# Patient Record
Sex: Female | Born: 1935 | Race: White | Hispanic: No | Smoking: Never smoker
Health system: Southern US, Community
[De-identification: ages and names within clinical notes are randomized; demographics above are authoritative.]

---

## 2018-12-23 ENCOUNTER — Other Ambulatory Visit: Payer: Self-pay

## 2018-12-23 ENCOUNTER — Inpatient Hospital Stay (HOSPITAL_COMMUNITY)
Admission: AD | Admit: 2018-12-23 | Discharge: 2018-12-28 | DRG: 177 | Disposition: A | Discharge status: Skilled Nursing Facility | Payer: No Typology Code available for payment source | Source: Other Acute Inpatient Hospital | Attending: Internal Medicine | Admitting: Internal Medicine

## 2018-12-23 ENCOUNTER — Encounter (HOSPITAL_COMMUNITY): Payer: Self-pay

## 2018-12-23 DIAGNOSIS — U071 COVID-19: Principal | ICD-10-CM | POA: Diagnosis present

## 2018-12-23 DIAGNOSIS — F0390 Unspecified dementia without behavioral disturbance: Secondary | ICD-10-CM | POA: Diagnosis present

## 2018-12-23 DIAGNOSIS — J9600 Acute respiratory failure, unspecified whether with hypoxia or hypercapnia: Secondary | ICD-10-CM | POA: Diagnosis not present

## 2018-12-23 DIAGNOSIS — I361 Nonrheumatic tricuspid (valve) insufficiency: Secondary | ICD-10-CM | POA: Diagnosis not present

## 2018-12-23 DIAGNOSIS — Z66 Do not resuscitate: Secondary | ICD-10-CM | POA: Diagnosis present

## 2018-12-23 DIAGNOSIS — E039 Hypothyroidism, unspecified: Secondary | ICD-10-CM | POA: Diagnosis present

## 2018-12-23 DIAGNOSIS — N289 Disorder of kidney and ureter, unspecified: Secondary | ICD-10-CM | POA: Diagnosis present

## 2018-12-23 DIAGNOSIS — K589 Irritable bowel syndrome without diarrhea: Secondary | ICD-10-CM | POA: Diagnosis present

## 2018-12-23 DIAGNOSIS — Z79890 Hormone replacement therapy (postmenopausal): Secondary | ICD-10-CM

## 2018-12-23 DIAGNOSIS — Z79899 Other long term (current) drug therapy: Secondary | ICD-10-CM | POA: Diagnosis not present

## 2018-12-23 DIAGNOSIS — I248 Other forms of acute ischemic heart disease: Secondary | ICD-10-CM | POA: Diagnosis present

## 2018-12-23 DIAGNOSIS — R935 Abnormal findings on diagnostic imaging of other abdominal regions, including retroperitoneum: Secondary | ICD-10-CM

## 2018-12-23 DIAGNOSIS — J9601 Acute respiratory failure with hypoxia: Secondary | ICD-10-CM | POA: Diagnosis present

## 2018-12-23 DIAGNOSIS — A09 Infectious gastroenteritis and colitis, unspecified: Secondary | ICD-10-CM | POA: Diagnosis present

## 2018-12-23 DIAGNOSIS — R7303 Prediabetes: Secondary | ICD-10-CM | POA: Diagnosis present

## 2018-12-23 DIAGNOSIS — R0902 Hypoxemia: Secondary | ICD-10-CM

## 2018-12-23 DIAGNOSIS — J1289 Other viral pneumonia: Secondary | ICD-10-CM | POA: Diagnosis present

## 2018-12-23 DIAGNOSIS — E876 Hypokalemia: Secondary | ICD-10-CM | POA: Diagnosis present

## 2018-12-23 DIAGNOSIS — J1282 Pneumonia due to coronavirus disease 2019: Secondary | ICD-10-CM | POA: Diagnosis present

## 2018-12-24 ENCOUNTER — Inpatient Hospital Stay (HOSPITAL_COMMUNITY): Payer: No Typology Code available for payment source

## 2018-12-24 DIAGNOSIS — I361 Nonrheumatic tricuspid (valve) insufficiency: Secondary | ICD-10-CM

## 2018-12-24 DIAGNOSIS — J1282 Pneumonia due to coronavirus disease 2019: Secondary | ICD-10-CM | POA: Diagnosis present

## 2018-12-24 DIAGNOSIS — U071 COVID-19: Secondary | ICD-10-CM | POA: Diagnosis present

## 2018-12-24 DIAGNOSIS — J9600 Acute respiratory failure, unspecified whether with hypoxia or hypercapnia: Secondary | ICD-10-CM | POA: Diagnosis present

## 2018-12-24 DIAGNOSIS — J1289 Other viral pneumonia: Secondary | ICD-10-CM

## 2018-12-24 LAB — CBC WITH DIFFERENTIAL/PLATELET
Abs Immature Granulocytes: 0.02 10*3/uL (ref 0.00–0.07)
Basophils Absolute: 0 10*3/uL (ref 0.0–0.1)
Basophils Relative: 0 %
Eosinophils Absolute: 0 10*3/uL (ref 0.0–0.5)
Eosinophils Relative: 0 %
HCT: 39.8 % (ref 36.0–46.0)
Hemoglobin: 12.8 g/dL (ref 12.0–15.0)
Immature Granulocytes: 0 %
Lymphocytes Relative: 18 %
Lymphs Abs: 1 10*3/uL (ref 0.7–4.0)
MCH: 29.7 pg (ref 26.0–34.0)
MCHC: 32.2 g/dL (ref 30.0–36.0)
MCV: 92.3 fL (ref 80.0–100.0)
Monocytes Absolute: 0.4 10*3/uL (ref 0.1–1.0)
Monocytes Relative: 8 %
Neutro Abs: 4.3 10*3/uL (ref 1.7–7.7)
Neutrophils Relative %: 74 %
Platelets: 228 10*3/uL (ref 150–400)
RBC: 4.31 MIL/uL (ref 3.87–5.11)
RDW: 14.3 % (ref 11.5–15.5)
WBC: 5.8 10*3/uL (ref 4.0–10.5)
nRBC: 0 % (ref 0.0–0.2)

## 2018-12-24 LAB — COMPREHENSIVE METABOLIC PANEL
ALT: 17 U/L (ref 0–44)
AST: 53 U/L — ABNORMAL HIGH (ref 15–41)
Albumin: 3.3 g/dL — ABNORMAL LOW (ref 3.5–5.0)
Alkaline Phosphatase: 59 U/L (ref 38–126)
Anion gap: 14 (ref 5–15)
BUN: 14 mg/dL (ref 8–23)
CO2: 26 mmol/L (ref 22–32)
Calcium: 8.5 mg/dL — ABNORMAL LOW (ref 8.9–10.3)
Chloride: 102 mmol/L (ref 98–111)
Creatinine, Ser: 0.85 mg/dL (ref 0.44–1.00)
GFR calc Af Amer: >60 mL/min (ref >60)
GFR calc non Af Amer: >60 mL/min (ref >60)
Glucose, Bld: 138 mg/dL — ABNORMAL HIGH (ref 70–99)
Potassium: 2.2 mmol/L — CL (ref 3.5–5.1)
Sodium: 142 mmol/L (ref 135–145)
Total Bilirubin: 0.4 mg/dL (ref 0.3–1.2)
Total Protein: 7.3 g/dL (ref 6.5–8.1)

## 2018-12-24 LAB — ECHOCARDIOGRAM LIMITED
Height: 65 in
Weight: 2433.88 oz

## 2018-12-24 LAB — BASIC METABOLIC PANEL
Anion gap: 14 (ref 5–15)
BUN: 13 mg/dL (ref 8–23)
CO2: 25 mmol/L (ref 22–32)
Calcium: 8.4 mg/dL — ABNORMAL LOW (ref 8.9–10.3)
Chloride: 104 mmol/L (ref 98–111)
Creatinine, Ser: 0.75 mg/dL (ref 0.44–1.00)
GFR calc Af Amer: >60 mL/min (ref >60)
GFR calc non Af Amer: >60 mL/min (ref >60)
Glucose, Bld: 89 mg/dL (ref 70–99)
Potassium: 3.2 mmol/L — ABNORMAL LOW (ref 3.5–5.1)
Sodium: 143 mmol/L (ref 135–145)

## 2018-12-24 LAB — MAGNESIUM: Magnesium: 1.9 mg/dL (ref 1.7–2.4)

## 2018-12-24 LAB — LIPID PANEL
Cholesterol: 225 mg/dL — ABNORMAL HIGH (ref 0–200)
HDL: 29 mg/dL — ABNORMAL LOW (ref >40)
LDL Cholesterol: 154 mg/dL — ABNORMAL HIGH (ref 0–99)
Total CHOL/HDL Ratio: 7.8 RATIO
Triglycerides: 212 mg/dL — ABNORMAL HIGH (ref <150)
VLDL: 42 mg/dL — ABNORMAL HIGH (ref 0–40)

## 2018-12-24 LAB — PROCALCITONIN: Procalcitonin: <0.1 ng/mL

## 2018-12-24 LAB — D-DIMER, QUANTITATIVE: D-Dimer, Quant: 0.66 ug/mL-FEU — ABNORMAL HIGH (ref 0.00–0.50)

## 2018-12-24 LAB — BRAIN NATRIURETIC PEPTIDE: B Natriuretic Peptide: 673.2 pg/mL — ABNORMAL HIGH (ref 0.0–100.0)

## 2018-12-24 LAB — ABO/RH: ABO/RH(D): O POS

## 2018-12-24 LAB — TROPONIN I (HIGH SENSITIVITY)
Troponin I (High Sensitivity): 3086 ng/L (ref <18)
Troponin I (High Sensitivity): 2732 ng/L (ref <18)

## 2018-12-24 LAB — HEMOGLOBIN A1C
Hgb A1c MFr Bld: 6.2 % — ABNORMAL HIGH (ref 4.8–5.6)
Mean Plasma Glucose: 131.24 mg/dL

## 2018-12-24 LAB — C-REACTIVE PROTEIN: CRP: 1 mg/dL — ABNORMAL HIGH (ref <1.0)

## 2018-12-24 MED ORDER — SODIUM CHLORIDE 0.9% FLUSH
3.0000 mL | Freq: Two times a day (BID) | INTRAVENOUS | Status: DC
  Administered 2018-12-24 – 2018-12-28 (×11): 3 mL via INTRAVENOUS

## 2018-12-24 MED ORDER — SERTRALINE HCL 50 MG PO TABS
50.0000 mg | ORAL_TABLET | Freq: Every day | ORAL | Status: DC
  Administered 2018-12-24 – 2018-12-28 (×5): 50 mg via ORAL
  Filled 2018-12-24 (×5): qty 1

## 2018-12-24 MED ORDER — POTASSIUM CHLORIDE CRYS ER 20 MEQ PO TBCR
40.0000 meq | EXTENDED_RELEASE_TABLET | Freq: Once | ORAL | Status: AC
  Administered 2018-12-24: 40 meq via ORAL
  Filled 2018-12-24: qty 2

## 2018-12-24 MED ORDER — ZINC SULFATE 220 (50 ZN) MG PO CAPS
220.0000 mg | ORAL_CAPSULE | Freq: Every day | ORAL | Status: DC
  Administered 2018-12-24 – 2018-12-28 (×5): 220 mg via ORAL
  Filled 2018-12-24 (×5): qty 1

## 2018-12-24 MED ORDER — ATORVASTATIN CALCIUM 40 MG PO TABS
40.0000 mg | ORAL_TABLET | Freq: Every day | ORAL | Status: DC
  Administered 2018-12-24 – 2018-12-28 (×5): 40 mg via ORAL
  Filled 2018-12-24 (×5): qty 1

## 2018-12-24 MED ORDER — ACETAMINOPHEN 325 MG PO TABS
650.0000 mg | ORAL_TABLET | Freq: Four times a day (QID) | ORAL | Status: DC | PRN
  Administered 2018-12-24 – 2018-12-25 (×2): 650 mg via ORAL
  Filled 2018-12-24 (×2): qty 2

## 2018-12-24 MED ORDER — VITAMIN C 500 MG PO TABS
500.0000 mg | ORAL_TABLET | Freq: Every day | ORAL | Status: DC
  Administered 2018-12-24 – 2018-12-28 (×5): 500 mg via ORAL
  Filled 2018-12-24 (×5): qty 1

## 2018-12-24 MED ORDER — POTASSIUM CHLORIDE CRYS ER 20 MEQ PO TBCR
40.0000 meq | EXTENDED_RELEASE_TABLET | ORAL | Status: AC
  Administered 2018-12-24 (×2): 40 meq via ORAL
  Filled 2018-12-24 (×2): qty 2

## 2018-12-24 MED ORDER — SODIUM CHLORIDE 0.9 % IV SOLN
200.0000 mg | Freq: Once | INTRAVENOUS | Status: AC
  Administered 2018-12-24: 200 mg via INTRAVENOUS
  Filled 2018-12-24: qty 40

## 2018-12-24 MED ORDER — LEVOTHYROXINE SODIUM 75 MCG PO TABS
75.0000 ug | ORAL_TABLET | Freq: Every day | ORAL | Status: DC
  Administered 2018-12-24 – 2018-12-28 (×5): 75 ug via ORAL
  Filled 2018-12-24 (×5): qty 1

## 2018-12-24 MED ORDER — DIVALPROEX SODIUM 125 MG PO CSDR
125.0000 mg | DELAYED_RELEASE_CAPSULE | Freq: Two times a day (BID) | ORAL | Status: DC
  Administered 2018-12-24 – 2018-12-28 (×10): 125 mg via ORAL
  Filled 2018-12-24 (×11): qty 1

## 2018-12-24 MED ORDER — ELUXADOLINE 75 MG PO TABS
75.0000 mg | ORAL_TABLET | Freq: Two times a day (BID) | ORAL | Status: DC
  Administered 2018-12-26: 75 mg via ORAL
  Filled 2018-12-24: qty 1

## 2018-12-24 MED ORDER — ONDANSETRON HCL 4 MG/2ML IJ SOLN: 4.0000 mg | Freq: Four times a day (QID) | INTRAMUSCULAR | Status: DC | PRN

## 2018-12-24 MED ORDER — ENOXAPARIN SODIUM 40 MG/0.4ML ~~LOC~~ SOLN
40.0000 mg | SUBCUTANEOUS | Status: DC
  Administered 2018-12-24 – 2018-12-28 (×5): 40 mg via SUBCUTANEOUS
  Filled 2018-12-24 (×5): qty 0.4

## 2018-12-24 MED ORDER — METOPROLOL TARTRATE 25 MG PO TABS
12.5000 mg | ORAL_TABLET | Freq: Two times a day (BID) | ORAL | Status: DC
  Administered 2018-12-24 – 2018-12-28 (×10): 12.5 mg via ORAL
  Filled 2018-12-24 (×10): qty 1

## 2018-12-24 MED ORDER — ASPIRIN 81 MG PO CHEW
81.0000 mg | CHEWABLE_TABLET | Freq: Every day | ORAL | Status: DC
  Administered 2018-12-24 – 2018-12-28 (×5): 81 mg via ORAL
  Filled 2018-12-24 (×8): qty 1

## 2018-12-24 MED ORDER — DEXAMETHASONE SODIUM PHOSPHATE 10 MG/ML IJ SOLN
6.0000 mg | INTRAMUSCULAR | Status: DC
  Administered 2018-12-24 – 2018-12-26 (×3): 6 mg via INTRAVENOUS
  Filled 2018-12-24 (×3): qty 1

## 2018-12-24 MED ORDER — INSULIN ASPART 100 UNIT/ML ~~LOC~~ SOLN: 0.0000 [IU] | Freq: Every day | SUBCUTANEOUS | Status: DC

## 2018-12-24 MED ORDER — GUAIFENESIN-DM 100-10 MG/5ML PO SYRP: 10.0000 mL | ORAL_SOLUTION | ORAL | Status: DC | PRN

## 2018-12-24 MED ORDER — SODIUM CHLORIDE 0.9 % IV SOLN: 250.0000 mL | INTRAVENOUS | Status: DC | PRN

## 2018-12-24 MED ORDER — SODIUM CHLORIDE 0.9 % IV SOLN
100.0000 mg | INTRAVENOUS | Status: AC
  Administered 2018-12-25 – 2018-12-28 (×4): 100 mg via INTRAVENOUS
  Filled 2018-12-24 (×4): qty 20

## 2018-12-24 MED ORDER — INSULIN ASPART 100 UNIT/ML ~~LOC~~ SOLN
0.0000 [IU] | Freq: Three times a day (TID) | SUBCUTANEOUS | Status: DC
  Administered 2018-12-24 – 2018-12-25 (×3): 1 [IU] via SUBCUTANEOUS
  Administered 2018-12-26: 3 [IU] via SUBCUTANEOUS
  Administered 2018-12-26: 2 [IU] via SUBCUTANEOUS
  Administered 2018-12-28 (×2): 1 [IU] via SUBCUTANEOUS

## 2018-12-24 MED ORDER — ONDANSETRON HCL 4 MG PO TABS: 4.0000 mg | ORAL_TABLET | Freq: Four times a day (QID) | ORAL | Status: DC | PRN

## 2018-12-24 MED ORDER — SODIUM CHLORIDE 0.9% FLUSH: 3.0000 mL | INTRAVENOUS | Status: DC | PRN

## 2018-12-24 MED ORDER — POTASSIUM CHLORIDE 10 MEQ/100ML IV SOLN
10.0000 meq | INTRAVENOUS | Status: AC
  Administered 2018-12-24 (×5): 10 meq via INTRAVENOUS
  Filled 2018-12-24 (×5): qty 100

## 2018-12-24 MED ORDER — HYDROCOD POLST-CPM POLST ER 10-8 MG/5ML PO SUER
5.0000 mL | Freq: Two times a day (BID) | ORAL | Status: DC | PRN
  Administered 2018-12-24 – 2018-12-26 (×3): 5 mL via ORAL
  Filled 2018-12-24 (×4): qty 5

## 2018-12-24 NOTE — Progress Notes (Signed)
  Echocardiogram 2D Echocardiogram has been performed.  Whitney Pearson 12/24/2018, 12:59 PM

## 2018-12-24 NOTE — H&P (Signed)
History and Physical    Whitney Pearson LKT:[id number] DOB: [birth year] DOA: 12/23/2018  PCP: No primary care provider on file.  Patient coming from: snf  Chief Complaint:  hypoxia  HPI: Whitney Pearson is a 85 y.o. female with medical history significant of dementia sent by snf for hypoxia.  No history available and pt cannot provide any due to her dementia.  Found to have covid pna with sats in the 73% range on RA.  Appears comfortable.  Referred for admission for covid pna.  Review of Systems: unobtainable due to dementia  History reviewed. No pertinent past medical history. unknonw  History reviewed. No pertinent surgical history.  unknown   reports that she has never smoked. She has never used smokeless tobacco. She reports that she does not drink alcohol or use drugs.  Not on File unknown  History reviewed. No pertinent family history. unknown  Prior to Admission medications   Medication Sig Start Date End Date Taking? Authorizing Provider  budesonide (PULMICORT) 0.25 MG/2ML nebulizer solution Take 0.25 mg by nebulization 2 (two) times daily.   Yes [provider]  calcium-vitamin D (OSCAL WITH D) 500-200 MG-UNIT tablet Take 1 tablet by mouth.   Yes [provider]  divalproex (DEPAKOTE SPRINKLE) 125 MG capsule Take 125 mg by mouth 2 (two) times daily.   Yes [provider]  Ensure (ENSURE) Take 237 mLs by mouth 3 (three) times daily between meals.   Yes [provider]  fexofenadine (ALLEGRA) 180 MG tablet Take 180 mg by mouth daily.   Yes [provider]  levothyroxine (SYNTHROID) 75 MCG tablet Take 75 mcg by mouth daily before breakfast.   Yes [provider]  oxymetazoline (AFRIN) 0.05 % nasal spray Place 1 spray into both nostrils 2 (two) times daily as needed for congestion.   Yes [provider]  potassium chloride (KLOR-CON) 10 MEQ tablet Take 10 mEq by mouth daily.   Yes [provider]  Probiotic Product (ALIGN) 4 MG CAPS Take 4 mg by mouth daily.   Yes [provider]  sertraline (ZOLOFT) 25 MG tablet Take 50 mg by mouth daily.   Yes [provider]    Physical Exam: Vitals:   12/23/18 2301  BP: 140/67  Pulse: 80  Resp: 18  Temp: 98.1 F (36.7 C)  TempSrc: Oral  Weight: 69 kg  Height: 5\' 5"  (1.651 m)      Constitutional: NAD, calm, comfortable Vitals:   12/23/18 2301  BP: 140/67  Pulse: 80  Resp: 18  Temp: 98.1 F (36.7 C)  TempSrc: Oral  Weight: 69 kg  Height: 5\' 5"  (1.651 m)   Eyes: PERRL, lids and conjunctivae normal ENMT: Mucous membranes are moist. Posterior pharynx clear of any exudate or lesions.Normal dentition.  Neck: normal, supple, no masses, no thyromegaly Respiratory: clear to auscultation bilaterally, no wheezing, no crackles. Normal respiratory effort. No accessory muscle use.  Cardiovascular: Regular rate and rhythm, no murmurs / rubs / gallops. No extremity edema. 2+ pedal pulses. No carotid bruits.  Abdomen: no tenderness, no masses palpated. No hepatosplenomegaly. Bowel sounds positive.  Musculoskeletal: no clubbing / cyanosis. No joint deformity upper and lower extremities. Good ROM, no contractures. Normal muscle tone.  Skin: no rashes, lesions, ulcers. No induration Neurologic:  No focal deficits.  dementia Psychiatric: not agitated   Labs on Admission: I have personally reviewed following labs and imaging studies  CBC: No results for input(s): WBC, NEUTROABS, HGB, HCT, MCV, PLT in  the last 168 hours. Basic Metabolic Panel: No results for input(s): NA, K, CL, CO2, GLUCOSE, BUN, CREATININE, CALCIUM, MG, PHOS in the last 168 hours. GFR: CrCl cannot be calculated (No successful lab value found.). Liver Function Tests: No results for input(s): AST, ALT, ALKPHOS, BILITOT, PROT, ALBUMIN in the last 168 hours. No results for input(s): LIPASE, AMYLASE in the last 168 hours. No results for  input(s): AMMONIA in the last 168 hours. Coagulation Profile: No results for input(s): INR, PROTIME in the last 168 hours. Cardiac Enzymes: No results for input(s): CKTOTAL, CKMB, CKMBINDEX, TROPONINI in the last 168 hours. BNP (last 3 results) No results for input(s): PROBNP in the last 8760 hours. HbA1C: No results for input(s): HGBA1C in the last 72 hours. CBG: No results for input(s): GLUCAP in the last 168 hours. Lipid Profile: No results for input(s): CHOL, HDL, LDLCALC, TRIG, CHOLHDL, LDLDIRECT in the last 72 hours. Thyroid Function Tests: No results for input(s): TSH, T4TOTAL, FREET4, T3FREE, THYROIDAB in the last 72 hours. Anemia Panel: No results for input(s): VITAMINB12, FOLATE, FERRITIN, TIBC, IRON, RETICCTPCT in the last 72 hours. Urine analysis: No results found for: COLORURINE, APPEARANCEUR, LABSPEC, PHURINE, GLUCOSEU, HGBUR, BILIRUBINUR, KETONESUR, PROTEINUR, UROBILINOGEN, NITRITE, LEUKOCYTESUR Sepsis Labs: !!!!!!!!!!!!!!!!!!!!!!!!!!!!!!!!!!!!!!!!!!!! @LABRCNTIP (procalcitonin:4,lacticidven:4) )No results found for this or any previous visit (from the past 240 hour(s)).   Radiological Exams on Admission: No results found.     Assessment/Plan 85 yo female with dementia with acute hypoxia from covid pna  Principal Problem:   Pneumonia due to COVID-19 virus- iv decadron and remdisivir.  Comfortable on [place].  Lovenox.  Ck crp, cmp, cbc, dimer, trop now.  Ct reported also neg pe at [place].    Active Problems:   Acute respiratory failure due to COVID-19 [facility])- as above    Dementia - stable   DVT prophylaxis: lovenox Code Status:  DNR Family Communication:  none Disposition Plan:  dayd Consults called:  none Admission status:  admission   DAVID,RACHAL A MD [facility]  If 7PM-7AM, please contact night-coverage [email or web address] Password [email or web address]  12/24/2018, 2:54 AM

## 2018-12-24 NOTE — Progress Notes (Signed)
Patient discussed with primary team. 85 yo female history of dementia, DNR status transferred from SNF with hypoxia to 70%. COVID +, being managed by primary team Contacted for elevated troponin. EKG reviewed, SR inferior Qwaves no specific acute changes. No chest pain, hemodynamically stable.  Trop peak 3000 trending down, echo pending. Given advanced age, dementia, poor functional status with reasonable explanation for trop elevation in setting of COVID with severe hypoxia would recommend only medical therapy with ASA, statin, beta blocker. Can get echo as if drop in LVEF may guide medical therapy, but would not consider ischemic testing for this patient.   Carlyle Dolly MD

## 2018-12-24 NOTE — Progress Notes (Signed)
Pharmacy Note - Remdesivir Dosing  O:  ALT:  17 CXR: PNA Requiring supplemental O2:  Sat 94% on RA   A/P:  Patient meets criteria for remdesivir.  Begin remdesivir 200 mg IV x 1, followed by 100 mg IV daily x 4 days  Monitor ALT, clinical progress  Despina Pole, Pharm. D. Clinical Pharmacist 12/24/2018 5:25 AM

## 2018-12-24 NOTE — TOC Initial Note (Addendum)
Transition of Care [facility]) - Initial/Assessment Note    Patient Details  Name: Whitney Pearson MRN: [id number] Date of Birth: 1933-07-20  [facility] CM/SW Contact:    Terrilee Croak, Student-Social Work Phone Number: 12/24/2018, 1:46 PM  Clinical Narrative:                    MSW Intern reviewed pt notes and needed additional information to determine next steps. MSW Intern called pt daughter to touch base. Daughter stated that pt was from [facility] and has been a resident of the memory care unit there for 5 years. She also reported that pt was part of [facility].     Daughter confirmed that next step after discharge is to return to [place], as they are very happy with her care there. Pt noted concerns about her mother since she has stopped being able to visit her. She visited pt everyday at [facility], since she had to stop, the pt has lost almost forty pounds. She is also upset, because of her mother's dementia, pt doesn't understand why she is not coming to visit, and cannot touch her. She also knows that pt is confused and disoriented, because she is not where she feels comfortable. Daughter just asked to be updated regularly.    MSW Intern called [facility] to discuss next steps, talked to a nurse in the Memory care unit who gave the number for the person who would be able to answer questions.    MSW Intern called Madalyn Rob, a care manager at [facility], and left a voicemail. Her contact is [phone].     Daughter also noted that patient worked with a Child psychotherapist at [employer]. She didn't remember a name, but granted permission for SW to call over there and discuss patient's situation.     Social Work will continue to follow.  UPDATE 3:50 PM: Leann returned phone call and updated social worker that pt was their first Covid case and she is unsure of the next steps for pt to return. She advised that she would speak with her leadership and provide an  update on Monday 12/26/2018 on the barriers to discharging back to [facility]. SW will continue to follow.        Patient Goals and CMS Choice        Expected Discharge Plan and Services                                                Prior Living Arrangements/Services                       Activities of Daily Living Home Assistive Devices/Equipment: Wheelchair ADL Screening (condition at time of admission) Patient's cognitive ability adequate to safely complete daily activities?: No Is the patient deaf or have difficulty hearing?: No Does the patient have difficulty seeing, even when wearing glasses/contacts?: No Does the patient have difficulty concentrating, remembering, or making decisions?: Yes Patient able to express need for assistance with ADLs?: No Does the patient have difficulty dressing or bathing?: Yes Independently performs ADLs?: No Communication: Independent Dressing (OT): Dependent Is this a change from baseline?: Pre-admission baseline Grooming: Dependent Is this a change from baseline?: Pre-admission baseline Feeding: Needs assistance Is this a change from baseline?: Pre-admission baseline Bathing: Dependent Is this a change  from baseline?: Pre-admission baseline In/Out Bed: Dependent Is this a change from baseline?: Pre-admission baseline Walks in Home: Dependent Is this a change from baseline?: Pre-admission baseline Does the patient have difficulty walking or climbing stairs?: Yes Weakness of Legs: Both Weakness of Arms/Hands: Both  Permission Sought/Granted                  Emotional Assessment              Admission diagnosis:  COVID-19 VIRUS INFECTION Patient Active Problem List   Diagnosis Date Noted  . Pneumonia due to COVID-19 virus 12/24/2018  . Acute respiratory failure due to COVID-19 [facility]) 12/24/2018   PCP:  System, Pcp Not In Pharmacy:  No Pharmacies Listed    Social Determinants of Health  (SDOH) Interventions    Readmission Risk Interventions No flowsheet data found.

## 2018-12-24 NOTE — Progress Notes (Signed)
PROGRESS NOTE    Whitney Pearson  ZOX:[id number] DOB: [birth year] DOA: 12/23/2018 PCP: System, Pcp Not In   Brief Narrative:  Whitney LippsBetty Childress Forero is Whitney Pearson 85 y.o. female with medical history significant of dementia sent by snf for hypoxia.  No history available and pt cannot provide any due to her dementia.  Found to have covid pna with sats in the 73% range on RA.  Appears comfortable.  Referred for admission for covid pna.  Assessment & Plan:   Principal Problem:   Pneumonia due to COVID-19 virus Active Problems:   Acute respiratory failure due to COVID-19 (HCC)  COVID 19 Viral Infection  Diarrhea:  Reportedly with O2 sats 73 on presentation Currently satting well on RA Remdesivir, steroids CT chest at [place] without central/lobar PE (limited exam), consolidation and volume loss in LLL with heterogenous attenuation concerning for underlying infection. Daily labs -> CBC, CMP, CRP, ferritin, D dimer I/O, daily weights Pt with diarrhea, suspect related to COVID 19 infection, continue to monitor - w/u further if worsening or persistent [facility]  COVID-19 Labs  Recent Labs    12/24/18 0238  DDIMER 0.66*  CRP 1.0*    No results found for: SARSCOV2NAA  Elevated Troponin: possibly related to COVID 19 infection with demand ischemia vs myocarditis vs NSTEMI.  Troponin downtrending.  Discussed with daughter who was agreeable with conservative management.  Discussed with cards, recommending ASA, statin, beta blocker. Echo with EF 55-60%, grade 2 diastolic dysfunction (see report) LDL 154, A1c 6.2  Dementia: delirium precautions, pt is impulsive, tele sitter ordered Continue depakote and zoloft  IBS: diarrhea could be 2/2 IBS, though seems worse than this - continue viberzi  Hypokalemia: replace and follow - follow mag  Hypothyroidism: continue synthroid  5 mm nodule in R major fissure: follow outpatient  1.6 CM lesion upper  pole L kidney: recommend f/u with US at some point  [facility] IMPRESSION: 1. Evaluation beyond the lobar level is limited due to respiratory motion artifact which is most pronounced in the lung bases. No central or lobar pulmonary emboli are seen 2. Focal consolidation and volume loss in the left lower lobe with heterogeneous attenuation suspicious for an underlying infection in the setting of known COVID-19 positivity. 3. Mild central pulmonary artery enlargement without elevation of the RV/LV ratio, suggestive of underlying pulmonary arterial hypertension. 4. Few calcified granulomata are present 5. 5mm solid nodule present right major fissure. No follow-up needed if patient is low-risk. Non-contrast chest CT can be considered in 12 months if patient is high-risk. This recommendation follows the consensus statement: Guidelines for Management of Incidental Pulmonary Nodules Detected on CT Images: From the Fleischner Society 2017; Radiology 2017; 284:228-243. 6. Indeterminate 1.6 cm intermediate attenuation lesion in upper pole left kidney, which could represent Shemiah Rosch proteinaceous/hemorrhagic cyst or solid mass. Further evaluation with ultrasound is recommended. 7. Aortic Atherosclerosis (ICD10-I70.0).  DVT prophylaxis: lovenox Code Status: DNR Family Communication: discussed with duaghter Disposition Plan: pending   Consultants:   Cardiology over phone  Procedures:  Echo IMPRESSIONS    1. Left ventricular ejection fraction, by visual estimation, is 55 to 60%. The left ventricle has normal function. There is no left ventricular hypertrophy.  2. Elevated left atrial pressure.  3. Left ventricular diastolic parameters are consistent with Grade II diastolic dysfunction (pseudonormalization).  4. Global right ventricle has normal systolic function.The right ventricular size is normal. No increase in right ventricular wall thickness.  5. Left atrial  size was normal.   6. Right atrial size was normal.  7. Mild mitral annular calcification.  8. The mitral valve is normal in structure. Trace mitral valve regurgitation. No evidence of mitral stenosis.  9. The tricuspid valve is normal in structure. Tricuspid valve regurgitation is mild. 10. The aortic valve is tricuspid. Aortic valve regurgitation is not visualized. No evidence of aortic valve sclerosis or stenosis. 11. The pulmonic valve was not well visualized. Pulmonic valve regurgitation is not visualized. 12. The interatrial septum was not well visualized.  Antimicrobials:  Anti-infectives (From admission, onward)   Start     Dose/Rate Route Frequency Ordered Stop   12/25/18 1000  remdesivir 100 mg in sodium chloride 0.9 % 250 mL IVPB     100 mg 500 mL/hr over 30 Minutes Intravenous Every 24 hours 12/24/18 0517 12/29/18 0959   12/24/18 0600  remdesivir 200 mg in sodium chloride 0.9 % 250 mL IVPB     200 mg 500 mL/hr over 30 Minutes Intravenous Once 12/24/18 0517 12/24/18 1147     Subjective: Confused Denies CP or SOB  Objective: Vitals:   12/23/18 2301 12/24/18 0400 12/24/18 0734 12/24/18 1540  BP: 140/67 123/70 133/85 133/85  Pulse: 80 68 72 73  Resp: 18 18 18 20   Temp: 98.1 F (36.7 C) 98.9 F (37.2 C) 97.6 F (36.4 C) 98.1 F (36.7 C)  TempSrc: Oral Oral Oral Oral  SpO2:  94% 95% 96%  Weight: 69 kg     Height: 5\' 5"  (1.651 m)       Intake/Output Summary (Last 24 hours) at 12/24/2018 1704 Last data filed at 12/24/2018 1500 Gross per 24 hour  Intake 250.28 ml  Output -  Net 250.28 ml   Filed Weights   12/23/18 2301  Weight: 69 kg    Examination:  General exam: Appears calm and comfortable  Respiratory system: unlabored Cardiovascular system: RRR Gastrointestinal system: Abdomen is nondistended, soft and nontender. Green diarrhea. Central nervous system: Alert and disoriented. No focal neurological deficits. Extremities: no LEE Skin: No rashes, lesions or ulcers  Psychiatry: Judgement and insight appear normal. Mood & affect appropriate.     Data Reviewed: I have personally reviewed following labs and imaging studies  CBC: Recent Labs  Lab 12/24/18 0238  WBC 5.8  NEUTROABS 4.3  HGB 12.8  HCT 39.8  MCV 92.3  PLT 578   Basic Metabolic Panel: Recent Labs  Lab 12/24/18 0238 12/24/18 0412 12/24/18 1302  NA 142  --  143  K 2.2*  --  3.2*  CL 102  --  104  CO2 26  --  25  GLUCOSE 138*  --  89  BUN 14  --  13  CREATININE 0.85  --  0.75  CALCIUM 8.5*  --  8.4*  MG  --  1.9  --    GFR: Estimated Creatinine Clearance: 52 mL/min (by C-G formula based on SCr of 0.75 mg/dL). Liver Function Tests: Recent Labs  Lab 12/24/18 0238  AST 53*  ALT 17  ALKPHOS 59  BILITOT 0.4  PROT 7.3  ALBUMIN 3.3*   No results for input(s): LIPASE, AMYLASE in the last 168 hours. No results for input(s): AMMONIA in the last 168 hours. Coagulation Profile: No results for input(s): INR, PROTIME in the last 168 hours. Cardiac Enzymes: No results for input(s): CKTOTAL, CKMB, CKMBINDEX, TROPONINI in the last 168 hours. BNP (last 3 results) No results for input(s): PROBNP in the last 8760 hours. HbA1C: Recent Labs  12/24/18 0238  HGBA1C 6.2*   CBG: No results for input(s): GLUCAP in the last 168 hours. Lipid Profile: Recent Labs    12/24/18 0800  CHOL 225*  HDL 29*  LDLCALC 154*  TRIG 212*  CHOLHDL 7.8   Thyroid Function Tests: No results for input(s): TSH, T4TOTAL, FREET4, T3FREE, THYROIDAB in the last 72 hours. Anemia Panel: No results for input(s): VITAMINB12, FOLATE, FERRITIN, TIBC, IRON, RETICCTPCT in the last 72 hours. Sepsis Labs: Recent Labs  Lab 12/24/18 0238  PROCALCITON <0.10    No results found for this or any previous visit (from the past 240 hour(s)).       Radiology Studies: No results found.      Scheduled Meds: . aspirin  81 mg Oral Daily  . atorvastatin  40 mg Oral q1800  . dexamethasone (DECADRON)  injection  6 mg Intravenous Q24H  . divalproex  125 mg Oral BID  . enoxaparin (LOVENOX) injection  40 mg Subcutaneous Q24H  . insulin aspart  0-5 Units Subcutaneous QHS  . insulin aspart  0-9 Units Subcutaneous TID WC  . levothyroxine  75 mcg Oral QAC breakfast  . metoprolol tartrate  12.5 mg Oral BID  . sertraline  50 mg Oral Daily  . sodium chloride flush  3 mL Intravenous Q12H  . vitamin C  500 mg Oral Daily  . zinc sulfate  220 mg Oral Daily   Continuous Infusions: . sodium chloride    . [START ON 12/25/2018] remdesivir 100 mg in NS 250 mL       LOS: 1 day    Time spent: over 30 min    Lacretia Nicks, MD [facility] Pager AMION  If 7PM-7AM, please contact night-coverage [email or web address] Password [facility] 12/24/2018, 5:04 PM

## 2018-12-24 NOTE — Progress Notes (Signed)
Spoke with Tessie Fass, her daughter, and answered all questions at this time.

## 2018-12-25 ENCOUNTER — Inpatient Hospital Stay (HOSPITAL_COMMUNITY): Payer: No Typology Code available for payment source

## 2018-12-25 LAB — CBC WITH DIFFERENTIAL/PLATELET
Abs Immature Granulocytes: 0.04 10*3/uL (ref 0.00–0.07)
Basophils Absolute: 0 10*3/uL (ref 0.0–0.1)
Basophils Relative: 1 %
Eosinophils Absolute: 0 10*3/uL (ref 0.0–0.5)
Eosinophils Relative: 0 %
HCT: 39.1 % (ref 36.0–46.0)
Hemoglobin: 12.5 g/dL (ref 12.0–15.0)
Immature Granulocytes: 1 %
Lymphocytes Relative: 25 %
Lymphs Abs: 1.4 10*3/uL (ref 0.7–4.0)
MCH: 29.3 pg (ref 26.0–34.0)
MCHC: 32 g/dL (ref 30.0–36.0)
MCV: 91.6 fL (ref 80.0–100.0)
Monocytes Absolute: 0.5 10*3/uL (ref 0.1–1.0)
Monocytes Relative: 8 %
Neutro Abs: 3.8 10*3/uL (ref 1.7–7.7)
Neutrophils Relative %: 65 %
Platelets: 235 10*3/uL (ref 150–400)
RBC: 4.27 MIL/uL (ref 3.87–5.11)
RDW: 14.2 % (ref 11.5–15.5)
WBC: 5.7 10*3/uL (ref 4.0–10.5)
nRBC: 0 % (ref 0.0–0.2)

## 2018-12-25 LAB — COMPREHENSIVE METABOLIC PANEL
ALT: 16 U/L (ref 0–44)
AST: 47 U/L — ABNORMAL HIGH (ref 15–41)
Albumin: 3.3 g/dL — ABNORMAL LOW (ref 3.5–5.0)
Alkaline Phosphatase: 57 U/L (ref 38–126)
Anion gap: 13 (ref 5–15)
BUN: 13 mg/dL (ref 8–23)
CO2: 25 mmol/L (ref 22–32)
Calcium: 8.8 mg/dL — ABNORMAL LOW (ref 8.9–10.3)
Chloride: 105 mmol/L (ref 98–111)
Creatinine, Ser: 0.88 mg/dL (ref 0.44–1.00)
GFR calc Af Amer: >60 mL/min (ref >60)
GFR calc non Af Amer: >60 mL/min (ref >60)
Glucose, Bld: 120 mg/dL — ABNORMAL HIGH (ref 70–99)
Potassium: 3 mmol/L — ABNORMAL LOW (ref 3.5–5.1)
Sodium: 143 mmol/L (ref 135–145)
Total Bilirubin: 0.4 mg/dL (ref 0.3–1.2)
Total Protein: 7.3 g/dL (ref 6.5–8.1)

## 2018-12-25 LAB — GLUCOSE, CAPILLARY: Glucose-Capillary: 98 mg/dL (ref 70–99)

## 2018-12-25 LAB — C-REACTIVE PROTEIN: CRP: 2.3 mg/dL — ABNORMAL HIGH (ref <1.0)

## 2018-12-25 LAB — FERRITIN: Ferritin: 132 ng/mL (ref 11–307)

## 2018-12-25 LAB — D-DIMER, QUANTITATIVE: D-Dimer, Quant: 0.56 ug/mL-FEU — ABNORMAL HIGH (ref 0.00–0.50)

## 2018-12-25 MED ORDER — POTASSIUM CHLORIDE CRYS ER 20 MEQ PO TBCR
40.0000 meq | EXTENDED_RELEASE_TABLET | ORAL | Status: AC
  Administered 2018-12-25 (×2): 40 meq via ORAL
  Filled 2018-12-25 (×2): qty 2

## 2018-12-25 NOTE — Progress Notes (Addendum)
PROGRESS NOTE    Whitney Pearson  UXL:[id number] DOB: 09/10/1933 DOA: 12/23/2018 PCP: System, Pcp Not In   Brief Narrative:  Whitney Pearson is a 85 y.o. female with medical history significant of dementia sent by snf for hypoxia.  No history available and pt cannot provide any due to her dementia.  Found to have covid pna with sats in the 73% range on RA.  Appears comfortable.  Referred for admission for covid pna.  Assessment & Plan:   Principal Problem:   Pneumonia due to COVID-19 virus Active Problems:   Acute respiratory failure due to COVID-19 (HCC)  COVID 19 Viral Infection  Diarrhea:  Reportedly with O2 sats 73 on presentation Currently satting well on RA Remdesivir, steroids CT chest at [place] without central/lobar PE (limited exam), consolidation and volume loss in LLL with heterogenous attenuation concerning for underlying infection. CXR 11/8 with LLL hazy airspace disease - atelectasis vs pneumonia Daily labs -> CBC, CMP, CRP, ferritin, D dimer I/O, daily weights Pt with diarrhea, suspect related to COVID 19 infection, continue to monitor - w/u further if worsening or persistent Positive COVID 19 testing documented at [facility] She looks improved today, sitting in chair  COVID-19 Labs  Recent Labs    12/24/18 0238 12/25/18 0218  DDIMER 0.66* 0.56*  FERRITIN  --  132  CRP 1.0* 2.3*    No results found for: SARSCOV2NAA  Elevated Troponin: possibly related to COVID 19 infection with demand ischemia vs myocarditis vs NSTEMI.  Troponin downtrending.  Discussed with daughter who was agreeable with conservative management.  Discussed with cards, recommending ASA, statin, beta blocker. Echo with EF 55-60%, grade 2 diastolic dysfunction (see report) - no reported WMA LDL 154, A1c 6.2  Dementia: delirium precautions, pt is impulsive, tele sitter ordered Continue depakote and zoloft  IBS: diarrhea could be 2/2 IBS, though seems worse than this -  continue viberzi  Hypokalemia: replace and follow - follow mag  Hypothyroidism: continue synthroid  5 mm nodule in R major fissure: follow outpatient  1.6 CM lesion upper pole L kidney: recommend f/u with [place] at some point  CT chest [place] IMPRESSION: 1. Evaluation beyond the lobar level is limited due to respiratory motion artifact which is most pronounced in the lung bases. No central or lobar pulmonary emboli are seen 2. Focal consolidation and volume loss in the left lower lobe with heterogeneous attenuation suspicious for an underlying infection in the setting of known COVID-19 positivity. 3. Mild central pulmonary artery enlargement without elevation of the RV/LV ratio, suggestive of underlying pulmonary arterial hypertension. 4. Few calcified granulomata are present 5. 40mm solid nodule present right major fissure. No follow-up needed if patient is low-risk. Non-contrast chest CT can be considered in 12 months if patient is high-risk. This recommendation follows the consensus statement: Guidelines for Management of Incidental Pulmonary Nodules Detected on CT Images: From the Fleischner Society 2017; Radiology 2017; 284:228-243. 6. Indeterminate 1.6 cm intermediate attenuation lesion in upper pole left kidney, which could represent a proteinaceous/hemorrhagic cyst or solid mass. Further evaluation with ultrasound is recommended. 7. Aortic Atherosclerosis (ICD10-I70.0).  DVT prophylaxis: lovenox Code Status: DNR Family Communication: discussed with duaghter 11/8 Disposition Plan: pending further improvement, completion of remdesivir   Consultants:   Cardiology over phone  Procedures:  Echo IMPRESSIONS    1. Left ventricular ejection fraction, by visual estimation, is 55 to 60%. The left ventricle has normal function. There is no left ventricular hypertrophy.  2. Elevated left atrial pressure.  3. Left ventricular diastolic parameters are consistent with Grade  II diastolic dysfunction (pseudonormalization).  4. Global right ventricle has normal systolic function.The right ventricular size is normal. No increase in right ventricular wall thickness.  5. Left atrial size was normal.  6. Right atrial size was normal.  7. Mild mitral annular calcification.  8. The mitral valve is normal in structure. Trace mitral valve regurgitation. No evidence of mitral stenosis.  9. The tricuspid valve is normal in structure. Tricuspid valve regurgitation is mild. 10. The aortic valve is tricuspid. Aortic valve regurgitation is not visualized. No evidence of aortic valve sclerosis or stenosis. 11. The pulmonic valve was not well visualized. Pulmonic valve regurgitation is not visualized. 12. The interatrial septum was not well visualized.  Antimicrobials:  Anti-infectives (From admission, onward)   Start     Dose/Rate Route Frequency Ordered Stop   12/25/18 1000  remdesivir 100 mg in sodium chloride 0.9 % 250 mL IVPB     100 mg 500 mL/hr over 30 Minutes Intravenous Every 24 hours 12/24/18 0517 12/29/18 0959   12/24/18 0600  remdesivir 200 mg in sodium chloride 0.9 % 250 mL IVPB     200 mg 500 mL/hr over 30 Minutes Intravenous Once 12/24/18 0517 12/24/18 1147     Subjective: Confused, A&Ox1 Pleasant.  No complaints.  Sitting up in chair.   Objective: Vitals:   12/24/18 1540 12/24/18 2035 12/25/18 0349 12/25/18 0748  BP: 133/85 130/85 (!) 128/96 (!) 159/88  Pulse: 73 86 61 60  Resp: 20 19 18 17   Temp: 98.1 F (36.7 C) 98.3 F (36.8 C) 98.5 F (36.9 C) 97.8 F (36.6 C)  TempSrc: Oral Oral Axillary Oral  SpO2: 96% 93% 98% 97%  Weight:      Height:        Intake/Output Summary (Last 24 hours) at 12/25/2018 1559 Last data filed at 12/25/2018 1500 Gross per 24 hour  Intake 250 ml  Output -  Net 250 ml   Filed Weights   12/23/18 2301  Weight: 69 kg    Examination:  General: No acute distress.  Sitting up in chair. Cardiovascular: RRR Lungs:  unlabored Abdomen: Soft, nontender, nondistended  Neurological: Alert and oriented 1. Moves all extremities 4. Cranial nerves II through XII grossly intact. Skin: Warm and dry. No rashes or lesions. Extremities: No clubbing or cyanosis. No edema.   Data Reviewed: I have personally reviewed following labs and imaging studies  CBC: Recent Labs  Lab 12/24/18 0238 12/25/18 0218  WBC 5.8 5.7  NEUTROABS 4.3 3.8  HGB 12.8 12.5  HCT 39.8 39.1  MCV 92.3 91.6  PLT 228 062   Basic Metabolic Panel: Recent Labs  Lab 12/24/18 0238 12/24/18 0412 12/24/18 1302 12/25/18 0218  NA 142  --  143 143  K 2.2*  --  3.2* 3.0*  CL 102  --  104 105  CO2 26  --  25 25  GLUCOSE 138*  --  89 120*  BUN 14  --  13 13  CREATININE 0.85  --  0.75 0.88  CALCIUM 8.5*  --  8.4* 8.8*  MG  --  1.9  --   --    GFR: Estimated Creatinine Clearance: 47.3 mL/min (by C-G formula based on SCr of 0.88 mg/dL). Liver Function Tests: Recent Labs  Lab 12/24/18 0238 12/25/18 0218  AST 53* 47*  ALT 17 16  ALKPHOS 59 57  BILITOT 0.4 0.4  PROT 7.3 7.3  ALBUMIN 3.3* 3.3*  No results for input(s): LIPASE, AMYLASE in the last 168 hours. No results for input(s): AMMONIA in the last 168 hours. Coagulation Profile: No results for input(s): INR, PROTIME in the last 168 hours. Cardiac Enzymes: No results for input(s): CKTOTAL, CKMB, CKMBINDEX, TROPONINI in the last 168 hours. BNP (last 3 results) No results for input(s): PROBNP in the last 8760 hours. HbA1C: Recent Labs    12/24/18 0238  HGBA1C 6.2*   CBG: Recent Labs  Lab 12/25/18 1122  GLUCAP 98   Lipid Profile: Recent Labs    12/24/18 0800  CHOL 225*  HDL 29*  LDLCALC 154*  TRIG 212*  CHOLHDL 7.8   Thyroid Function Tests: No results for input(s): TSH, T4TOTAL, FREET4, T3FREE, THYROIDAB in the last 72 hours. Anemia Panel: Recent Labs    12/25/18 0218  FERRITIN 132   Sepsis Labs: Recent Labs  Lab 12/24/18 0238  PROCALCITON <0.10     No results found for this or any previous visit (from the past 240 hour(s)).       Radiology Studies: Dg Chest Port 1 View  Result Date: 12/25/2018 CLINICAL DATA:  Hypoxia EXAM: PORTABLE CHEST 1 VIEW COMPARISON:  Chest x-ray 12/22/2018 CT chest 12/22/2018 FINDINGS: There is left lower lobe airspace disease. There is no pleural effusion or pneumothorax. The right lung is clear. The heart and mediastinal contours are unremarkable. There is no acute osseous abnormality. IMPRESSION: Left lower lobe hazy airspace disease which may reflect atelectasis versus pneumonia. Electronically Signed   By: Elige KoHetal  Patel   On: 12/25/2018 14:04        Scheduled Meds: . aspirin  81 mg Oral Daily  . atorvastatin  40 mg Oral q1800  . dexamethasone (DECADRON) injection  6 mg Intravenous Q24H  . divalproex  125 mg Oral BID  . Eluxadoline  75 mg Oral BID WC  . enoxaparin (LOVENOX) injection  40 mg Subcutaneous Q24H  . insulin aspart  0-5 Units Subcutaneous QHS  . insulin aspart  0-9 Units Subcutaneous TID WC  . levothyroxine  75 mcg Oral QAC breakfast  . metoprolol tartrate  12.5 mg Oral BID  . sertraline  50 mg Oral Daily  . sodium chloride flush  3 mL Intravenous Q12H  . vitamin C  500 mg Oral Daily  . zinc sulfate  220 mg Oral Daily   Continuous Infusions: . sodium chloride    . remdesivir 100 mg in NS 250 mL 100 mg (12/25/18 0956)     LOS: 2 days    Time spent: over 30 min    Lacretia Nicksaldwell Powell, MD [facility] Pager AMION  If 7PM-7AM, please contact night-coverage [email or web address] Password [email or web address] 12/25/2018, 3:59 PM

## 2018-12-25 NOTE — Progress Notes (Signed)
Spoke with daughter Tessie Fass and gave an update.  She will bring her mother's home med in tomorrow between 10 and 2.

## 2018-12-26 LAB — GLUCOSE, CAPILLARY
Glucose-Capillary: 126 mg/dL — ABNORMAL HIGH (ref 70–99)
Glucose-Capillary: 98 mg/dL (ref 70–99)
Glucose-Capillary: 95 mg/dL (ref 70–99)
Glucose-Capillary: 101 mg/dL — ABNORMAL HIGH (ref 70–99)
Glucose-Capillary: 142 mg/dL — ABNORMAL HIGH (ref 70–99)
Glucose-Capillary: 77 mg/dL (ref 70–99)
Glucose-Capillary: 126 mg/dL — ABNORMAL HIGH (ref 70–99)
Glucose-Capillary: 152 mg/dL — ABNORMAL HIGH (ref 70–99)
Glucose-Capillary: 206 mg/dL — ABNORMAL HIGH (ref 70–99)

## 2018-12-26 LAB — CBC WITH DIFFERENTIAL/PLATELET
Abs Immature Granulocytes: 0.01 10*3/uL (ref 0.00–0.07)
Basophils Absolute: 0 10*3/uL (ref 0.0–0.1)
Basophils Relative: 0 %
Eosinophils Absolute: 0 10*3/uL (ref 0.0–0.5)
Eosinophils Relative: 0 %
HCT: 46.3 % — ABNORMAL HIGH (ref 36.0–46.0)
Hemoglobin: 14.6 g/dL (ref 12.0–15.0)
Immature Granulocytes: 0 %
Lymphocytes Relative: 55 %
Lymphs Abs: 2.6 10*3/uL (ref 0.7–4.0)
MCH: 29.2 pg (ref 26.0–34.0)
MCHC: 31.5 g/dL (ref 30.0–36.0)
MCV: 92.6 fL (ref 80.0–100.0)
Monocytes Absolute: 0.5 10*3/uL (ref 0.1–1.0)
Monocytes Relative: 11 %
Neutro Abs: 1.6 10*3/uL — ABNORMAL LOW (ref 1.7–7.7)
Neutrophils Relative %: 34 %
Platelets: 257 10*3/uL (ref 150–400)
RBC: 5 MIL/uL (ref 3.87–5.11)
RDW: 14.4 % (ref 11.5–15.5)
WBC: 4.8 10*3/uL (ref 4.0–10.5)
nRBC: 0 % (ref 0.0–0.2)

## 2018-12-26 LAB — COMPREHENSIVE METABOLIC PANEL
ALT: 15 U/L (ref 0–44)
AST: 41 U/L (ref 15–41)
Albumin: 3.7 g/dL (ref 3.5–5.0)
Alkaline Phosphatase: 62 U/L (ref 38–126)
Anion gap: 11 (ref 5–15)
BUN: 18 mg/dL (ref 8–23)
CO2: 27 mmol/L (ref 22–32)
Calcium: 9.1 mg/dL (ref 8.9–10.3)
Chloride: 105 mmol/L (ref 98–111)
Creatinine, Ser: 0.75 mg/dL (ref 0.44–1.00)
GFR calc Af Amer: >60 mL/min (ref >60)
GFR calc non Af Amer: >60 mL/min (ref >60)
Glucose, Bld: 109 mg/dL — ABNORMAL HIGH (ref 70–99)
Potassium: 3.1 mmol/L — ABNORMAL LOW (ref 3.5–5.1)
Sodium: 143 mmol/L (ref 135–145)
Total Bilirubin: 0.5 mg/dL (ref 0.3–1.2)
Total Protein: 8.3 g/dL — ABNORMAL HIGH (ref 6.5–8.1)

## 2018-12-26 LAB — MAGNESIUM: Magnesium: 1.9 mg/dL (ref 1.7–2.4)

## 2018-12-26 LAB — C-REACTIVE PROTEIN: CRP: 2 mg/dL — ABNORMAL HIGH (ref <1.0)

## 2018-12-26 LAB — FERRITIN: Ferritin: 152 ng/mL (ref 11–307)

## 2018-12-26 LAB — D-DIMER, QUANTITATIVE: D-Dimer, Quant: 0.46 ug/mL-FEU (ref 0.00–0.50)

## 2018-12-26 MED ORDER — POTASSIUM CHLORIDE CRYS ER 20 MEQ PO TBCR
40.0000 meq | EXTENDED_RELEASE_TABLET | ORAL | Status: AC
  Administered 2018-12-26 (×2): 40 meq via ORAL
  Filled 2018-12-26 (×2): qty 2

## 2018-12-26 MED ORDER — ELUXADOLINE 75 MG PO TABS
75.0000 mg | ORAL_TABLET | Freq: Two times a day (BID) | ORAL | Status: DC
  Filled 2018-12-26 (×2): qty 1

## 2018-12-26 MED ORDER — DEXAMETHASONE SODIUM PHOSPHATE 10 MG/ML IJ SOLN
6.0000 mg | INTRAMUSCULAR | Status: DC
  Administered 2018-12-27: 6 mg via INTRAVENOUS
  Filled 2018-12-26: qty 1

## 2018-12-26 MED ORDER — AMLODIPINE BESYLATE 5 MG PO TABS: 5.0000 mg | ORAL_TABLET | Freq: Every day | ORAL | Status: DC

## 2018-12-26 MED ORDER — ELUXADOLINE 75 MG PO TABS
75.0000 mg | ORAL_TABLET | Freq: Two times a day (BID) | ORAL | Status: DC
  Administered 2018-12-26 – 2018-12-28 (×4): 75 mg via ORAL
  Filled 2018-12-26 (×3): qty 1

## 2018-12-26 NOTE — Progress Notes (Signed)
PROGRESS NOTE    Whitney LippsBetty Childress Pearson  ZOX:[id number] DOB: 27-Oct-1933 DOA: 12/23/2018 PCP: System, Pcp Not In   Brief Narrative:  Whitney Pearson is Whitney Pearson 85 y.o. female with medical history significant of dementia sent by snf for hypoxia.  No history available and pt cannot provide any due to her dementia.  Found to have covid pna with sats in the 73% range on RA.  Appears comfortable.  Referred for admission for covid pna.  Assessment & Plan:   Principal Problem:   Pneumonia due to COVID-19 virus Active Problems:   Acute respiratory failure due to COVID-19 (HCC)  COVID 19 Viral Infection  Diarrhea:  Reportedly with O2 sats 73 on presentation Currently satting well on RA Remdesivir, steroids CT chest at [place] without central/lobar PE (limited exam), consolidation and volume loss in LLL with heterogenous attenuation concerning for underlying infection. CXR 11/8 with LLL hazy airspace disease - atelectasis vs pneumonia Daily labs -> CBC, CMP, CRP, ferritin, D dimer - improving  I/O, daily weights Pt with diarrhea, suspect related to COVID 19 infection, continue to monitor - w/u further if worsening or persistent [facility]  COVID-19 Labs  Recent Labs    12/24/18 0238 12/25/18 0218 12/26/18 0515  DDIMER 0.66* 0.56* 0.46  FERRITIN  --  132 152  CRP 1.0* 2.3* 2.0*    No results found for: SARSCOV2NAA  Elevated Troponin: possibly related to COVID 19 infection with demand ischemia vs myocarditis vs NSTEMI.  Troponin downtrending.  Discussed with daughter who was agreeable with conservative management.   Discussed with cards, recommending ASA, statin, beta blocker. Echo with EF 55-60%, grade 2 diastolic dysfunction (see report) - no reported WMA LDL 154, A1c 6.2  Prediabetes: A1c 6.2.  continue SSI, fasting BG reasonable at this time.  Follow.   Dementia: delirium precautions, pt is impulsive, tele sitter ordered Continue  depakote and zoloft  IBS: diarrhea could be 2/2 IBS, though seems worse than this - continue viberzi  Hypokalemia: replace and follow - follow mag  Hypothyroidism: continue synthroid  5 mm nodule in R major fissure: follow outpatient  1.6 CM lesion upper pole L kidney: recommend f/u with [place] at some point  [place] IMPRESSION: 1. Evaluation beyond the lobar level is limited due to respiratory motion artifact which is most pronounced in the lung bases. No central or lobar pulmonary emboli are seen 2. Focal consolidation and volume loss in the left lower lobe with heterogeneous attenuation suspicious for an underlying infection in the setting of known COVID-19 positivity. 3. Mild central pulmonary artery enlargement without elevation of the RV/LV ratio, suggestive of underlying pulmonary arterial hypertension. 4. Few calcified granulomata are present 5. 5mm solid nodule present right major fissure. No follow-up needed if patient is low-risk. Non-contrast chest CT can be considered in 12 months if patient is high-risk. This recommendation follows the consensus statement: Guidelines for Management of Incidental Pulmonary Nodules Detected on CT Images: From the Fleischner Society 2017; Radiology 2017; 284:228-243. 6. Indeterminate 1.6 cm intermediate attenuation lesion in upper pole left kidney, which could represent Armonie Staten proteinaceous/hemorrhagic cyst or solid mass. Further evaluation with ultrasound is recommended. 7. Aortic Atherosclerosis (ICD10-I70.0).  DVT prophylaxis: lovenox Code Status: DNR Family Communication: discussed with duaghter 11/8 Disposition Plan: pending further improvement, completion of remdesivir   Consultants:   Cardiology over phone  Procedures:  Echo IMPRESSIONS    1. Left ventricular ejection fraction, by visual estimation, is 55 to 60%. The  left ventricle has normal function. There is no left ventricular hypertrophy.  2. Elevated left  atrial pressure.  3. Left ventricular diastolic parameters are consistent with Grade II diastolic dysfunction (pseudonormalization).  4. Global right ventricle has normal systolic function.The right ventricular size is normal. No increase in right ventricular wall thickness.  5. Left atrial size was normal.  6. Right atrial size was normal.  7. Mild mitral annular calcification.  8. The mitral valve is normal in structure. Trace mitral valve regurgitation. No evidence of mitral stenosis.  9. The tricuspid valve is normal in structure. Tricuspid valve regurgitation is mild. 10. The aortic valve is tricuspid. Aortic valve regurgitation is not visualized. No evidence of aortic valve sclerosis or stenosis. 11. The pulmonic valve was not well visualized. Pulmonic valve regurgitation is not visualized. 12. The interatrial septum was not well visualized.  Antimicrobials:  Anti-infectives (From admission, onward)   Start     Dose/Rate Route Frequency Ordered Stop   12/25/18 1000  remdesivir 100 mg in sodium chloride 0.9 % 250 mL IVPB     100 mg 500 mL/hr over 30 Minutes Intravenous Every 24 hours 12/24/18 0517 12/29/18 0959   12/24/18 0600  remdesivir 200 mg in sodium chloride 0.9 % 250 mL IVPB     200 mg 500 mL/hr over 30 Minutes Intravenous Once 12/24/18 0517 12/24/18 1147     Subjective: Confused, Whitney Pearson&Ox1, no complaints.  Objective: Vitals:   12/25/18 1934 12/26/18 0344 12/26/18 0800 12/26/18 0928  BP: (!) 159/67 (!) 127/94 (!) 142/69 (!) 142/69  Pulse: 63 (!) 55 (!) 53 (!) 59  Resp:  19 19 19   Temp: 97.7 F (36.5 C) 97.9 F (36.6 C)  (!) 96.5 F (35.8 C)  TempSrc: Oral Axillary  Axillary  SpO2: 97% 98% 96% 98%  Weight:      Height:        Intake/Output Summary (Last 24 hours) at 12/26/2018 1358 Last data filed at 12/25/2018 1500 Gross per 24 hour  Intake 250 ml  Output -  Net 250 ml   Filed Weights   12/23/18 2301  Weight: 69 kg    Examination:  General: No acute  distress. Cardiovascular: RRR Lungs: unlabored. Abdomen: Soft, nontender, nondistended Neurological: Alert and oriented 3. Moves all extremities 4. Cranial nerves II through XII grossly intact. Skin: Warm and dry. No rashes or lesions. Extremities: No clubbing or cyanosis. No edema.   Data Reviewed: I have personally reviewed following labs and imaging studies  CBC: Recent Labs  Lab 12/24/18 0238 12/25/18 0218 12/26/18 0515  WBC 5.8 5.7 4.8  NEUTROABS 4.3 3.8 1.6*  HGB 12.8 12.5 14.6  HCT 39.8 39.1 46.3*  MCV 92.3 91.6 92.6  PLT 228 235 557   Basic Metabolic Panel: Recent Labs  Lab 12/24/18 0238 12/24/18 0412 12/24/18 1302 12/25/18 0218 12/26/18 0515  NA 142  --  143 143 143  K 2.2*  --  3.2* 3.0* 3.1*  CL 102  --  104 105 105  CO2 26  --  25 25 27   GLUCOSE 138*  --  89 120* 109*  BUN 14  --  13 13 18   CREATININE 0.85  --  0.75 0.88 0.75  CALCIUM 8.5*  --  8.4* 8.8* 9.1  MG  --  1.9  --   --  1.9   GFR: Estimated Creatinine Clearance: 52 mL/min (by C-G formula based on SCr of 0.75 mg/dL). Liver Function Tests: Recent Labs  Lab 12/24/18 0238 12/25/18  2751 12/26/18 0515  AST 53* 47* 41  ALT 17 16 15   ALKPHOS 59 57 62  BILITOT 0.4 0.4 0.5  PROT 7.3 7.3 8.3*  ALBUMIN 3.3* 3.3* 3.7   No results for input(s): LIPASE, AMYLASE in the last 168 hours. No results for input(s): AMMONIA in the last 168 hours. Coagulation Profile: No results for input(s): INR, PROTIME in the last 168 hours. Cardiac Enzymes: No results for input(s): CKTOTAL, CKMB, CKMBINDEX, TROPONINI in the last 168 hours. BNP (last 3 results) No results for input(s): PROBNP in the last 8760 hours. HbA1C: Recent Labs    12/24/18 0238  HGBA1C 6.2*   CBG: Recent Labs  Lab 12/25/18 1122 12/25/18 1552 12/25/18 2114 12/26/18 0813 12/26/18 1120  GLUCAP 98 77 126* 152* 206*   Lipid Profile: Recent Labs    12/24/18 0800  CHOL 225*  HDL 29*  LDLCALC 154*  TRIG 212*  CHOLHDL 7.8    Thyroid Function Tests: No results for input(s): TSH, T4TOTAL, FREET4, T3FREE, THYROIDAB in the last 72 hours. Anemia Panel: Recent Labs    12/25/18 0218 12/26/18 0515  FERRITIN 132 152   Sepsis Labs: Recent Labs  Lab 12/24/18 0238  PROCALCITON <0.10    No results found for this or any previous visit (from the past 240 hour(s)).       Radiology Studies: Dg Chest Port 1 View  Result Date: 12/25/2018 CLINICAL DATA:  Hypoxia EXAM: PORTABLE CHEST 1 VIEW COMPARISON:  Chest x-ray 12/22/2018 CT chest 12/22/2018 FINDINGS: There is left lower lobe airspace disease. There is no pleural effusion or pneumothorax. The right lung is clear. The heart and mediastinal contours are unremarkable. There is no acute osseous abnormality. IMPRESSION: Left lower lobe hazy airspace disease which may reflect atelectasis versus pneumonia. Electronically Signed   By: 13/06/2018   On: 12/25/2018 14:04        Scheduled Meds: . aspirin  81 mg Oral Daily  . atorvastatin  40 mg Oral q1800  . divalproex  125 mg Oral BID  . Eluxadoline  75 mg Oral BID WC  . enoxaparin (LOVENOX) injection  40 mg Subcutaneous Q24H  . insulin aspart  0-5 Units Subcutaneous QHS  . insulin aspart  0-9 Units Subcutaneous TID WC  . levothyroxine  75 mcg Oral QAC breakfast  . metoprolol tartrate  12.5 mg Oral BID  . sertraline  50 mg Oral Daily  . sodium chloride flush  3 mL Intravenous Q12H  . vitamin C  500 mg Oral Daily  . zinc sulfate  220 mg Oral Daily   Continuous Infusions: . sodium chloride    . remdesivir 100 mg in NS 250 mL 100 mg (12/26/18 1126)     LOS: 3 days    Time spent: over 30 min    13/09/20, MD [facility] Pager AMION  If 7PM-7AM, please contact night-coverage [email or web address] Password [email or web address] 12/26/2018, 1:58 PM

## 2018-12-26 NOTE — Progress Notes (Signed)
Pharmacy Medication Storage Note  Storing home medications for Whitney Pearson in pharmacy secured storage.   Medication storage bag number: [id number]  Delivered to pharmacy @ 1228 (time) by Alma Friendly  Medications will be returned to patient/caregiver upon discharge.  Minh Pham 12/26/18 12:34 PM

## 2018-12-26 NOTE — Evaluation (Signed)
Physical Therapy Evaluation Patient Details Name: [name] MRN: [id number] DOB: 03/05/1933 Today's Date: 12/26/2018   History of Present Illness  Whitney Pearson is a 85 y.o. female with medical history significant of dementia [facility] care x 5 years.Patient hypoxic,  Found to have covid pna  Clinical Impression  The patient is very pleasant. Assisted by 1 person to transfer to [facility] then to recliner with mod assist. Patient  Comes from memory care at [facility]. Return there is unknown . Pt admitted with above diagnosis.   Pt currently with functional limitations due to the deficits listed below (see PT Problem List). Pt will benefit from skilled PT to increase their independence and safety with mobility to allow discharge to the venue listed below.       Follow Up Recommendations SNF(or return to ALF)    Equipment Recommendations  None recommended by PT    Recommendations for Other Services       Precautions / Restrictions Precautions Precautions: Fall Precaution Comments: incontinence      Mobility  Bed Mobility Overal bed mobility: Needs Assistance Bed Mobility: Rolling;Sidelying to Sit Rolling: Min assist         General bed mobility comments: multimodal cues , patient able to roll and reach for rail. Mod assist for legs and trunk to prepare to sit up.  Transfers Overall transfer level: Needs assistance Equipment used: 1 person hand held assist Transfers: Sit to/from [facility] Sit to Stand: Mod assist Stand pivot transfers: Mod assist       General transfer comment: steady assist to rise from bed and BSC, small steps to  transfer from bed to [facility] then to recliner. patient able to bear weight and reach to armrests with multimodal cues  Ambulation/Gait             General Gait Details: tba  Stairs            Wheelchair Mobility    Modified Rankin (Stroke Patients Only)        Balance Overall balance assessment: Needs assistance Sitting-balance support: No upper extremity supported;Feet supported Sitting balance-Leahy Scale: Fair     Standing balance support: During functional activity;Single extremity supported Standing balance-Leahy Scale: Poor Standing balance comment: requires steady support                             Pertinent Vitals/Pain      Home Living Family/patient expects to be discharged to:: Skilled nursing facility                      Prior Function Level of Independence: Needs assistance   Gait / Transfers Assistance Needed: unsure, Patient stated" I just get up and walk"  ADL's / Homemaking Assistance Needed: requires assist        Hand Dominance        Extremity/Trunk Assessment   Upper Extremity Assessment Upper Extremity Assessment: Generalized weakness    Lower Extremity Assessment Lower Extremity Assessment: Generalized weakness    Cervical / Trunk Assessment Cervical / Trunk Assessment: Normal  Communication      Cognition Arousal/Alertness: Awake/alert Behavior During Therapy: WFL for tasks assessed/performed Overall Cognitive Status: History of cognitive impairments - at baseline  General Comments: able to follow  simple commands to assist with bed mobility and transfers      General Comments      Exercises     Assessment/Plan    PT Assessment Patient needs continued PT services  PT Problem List Decreased strength;Decreased mobility;Decreased activity tolerance;Decreased cognition;Decreased balance;Decreased knowledge of use of DME;Decreased skin integrity       PT Treatment Interventions DME instruction;Therapeutic activities;Gait training;Therapeutic exercise;Patient/family education;Functional mobility training;Cognitive remediation    PT Goals (Current goals can be found in the Care Plan section)  Acute Rehab PT  Goals Patient Stated Goal: agreed to get up PT Goal Formulation: Patient unable to participate in goal setting Time For Goal Achievement: 01/09/19 Potential to Achieve Goals: Fair    Frequency Min 2X/week   Barriers to discharge        Co-evaluation               AM-PAC PT "6 Clicks" Mobility  Outcome Measure Help needed turning from your back to your side while in a flat bed without using bedrails?: A Lot Help needed moving from lying on your back to sitting on the side of a flat bed without using bedrails?: A Lot Help needed moving to and from a bed to a chair (including a wheelchair)?: A Lot Help needed standing up from a chair using your arms (e.g., wheelchair or bedside chair)?: A Lot Help needed to walk in hospital room?: Total Help needed climbing 3-5 steps with a railing? : Total 6 Click Score: 10    End of Session   Activity Tolerance: Patient tolerated treatment well Patient left: in chair;with call bell/phone within reach;with chair alarm set Nurse Communication: Mobility status PT Visit Diagnosis: Unsteadiness on feet (R26.81);Difficulty in walking, not elsewhere classified (R26.2)    Time: 8828-0034 PT Time Calculation (min) (ACUTE ONLY): 32 min   Charges:   PT Evaluation $PT Eval Moderate Complexity: 1 Mod PT Treatments $Therapeutic Activity: 8-22 mins        Blanchard Kelch PT Acute Rehabilitation Services Pager [phone] Office [phone]   Rada Hay 12/26/2018, 1:43 PM

## 2018-12-26 NOTE — Plan of Care (Signed)

## 2018-12-27 ENCOUNTER — Inpatient Hospital Stay (HOSPITAL_COMMUNITY): Payer: No Typology Code available for payment source

## 2018-12-27 LAB — CBC WITH DIFFERENTIAL/PLATELET
Abs Immature Granulocytes: 0.04 10*3/uL (ref 0.00–0.07)
Basophils Absolute: 0 10*3/uL (ref 0.0–0.1)
Basophils Relative: 0 %
Eosinophils Absolute: 0 10*3/uL (ref 0.0–0.5)
Eosinophils Relative: 0 %
HCT: 47.4 % — ABNORMAL HIGH (ref 36.0–46.0)
Hemoglobin: 15.4 g/dL — ABNORMAL HIGH (ref 12.0–15.0)
Immature Granulocytes: 1 %
Lymphocytes Relative: 16 %
Lymphs Abs: 1.1 10*3/uL (ref 0.7–4.0)
MCH: 29.4 pg (ref 26.0–34.0)
MCHC: 32.5 g/dL (ref 30.0–36.0)
MCV: 90.5 fL (ref 80.0–100.0)
Monocytes Absolute: 0.2 10*3/uL (ref 0.1–1.0)
Monocytes Relative: 3 %
Neutro Abs: 5.4 10*3/uL (ref 1.7–7.7)
Neutrophils Relative %: 80 %
Platelets: 289 10*3/uL (ref 150–400)
RBC: 5.24 MIL/uL — ABNORMAL HIGH (ref 3.87–5.11)
RDW: 14.2 % (ref 11.5–15.5)
WBC: 6.8 10*3/uL (ref 4.0–10.5)
nRBC: 0 % (ref 0.0–0.2)

## 2018-12-27 LAB — COMPREHENSIVE METABOLIC PANEL
ALT: 22 U/L (ref 0–44)
AST: 39 U/L (ref 15–41)
Albumin: 3.7 g/dL (ref 3.5–5.0)
Alkaline Phosphatase: 66 U/L (ref 38–126)
Anion gap: 11 (ref 5–15)
BUN: 20 mg/dL (ref 8–23)
CO2: 26 mmol/L (ref 22–32)
Calcium: 8.9 mg/dL (ref 8.9–10.3)
Chloride: 101 mmol/L (ref 98–111)
Creatinine, Ser: 0.68 mg/dL (ref 0.44–1.00)
GFR calc Af Amer: >60 mL/min (ref >60)
GFR calc non Af Amer: >60 mL/min (ref >60)
Glucose, Bld: 166 mg/dL — ABNORMAL HIGH (ref 70–99)
Potassium: 3 mmol/L — ABNORMAL LOW (ref 3.5–5.1)
Sodium: 138 mmol/L (ref 135–145)
Total Bilirubin: 0.8 mg/dL (ref 0.3–1.2)
Total Protein: 8.1 g/dL (ref 6.5–8.1)

## 2018-12-27 LAB — C-REACTIVE PROTEIN: CRP: 0.8 mg/dL (ref <1.0)

## 2018-12-27 LAB — GLUCOSE, CAPILLARY
Glucose-Capillary: 81 mg/dL (ref 70–99)
Glucose-Capillary: 121 mg/dL — ABNORMAL HIGH (ref 70–99)
Glucose-Capillary: 168 mg/dL — ABNORMAL HIGH (ref 70–99)

## 2018-12-27 LAB — D-DIMER, QUANTITATIVE: D-Dimer, Quant: 0.4 ug/mL-FEU (ref 0.00–0.50)

## 2018-12-27 LAB — FERRITIN: Ferritin: 166 ng/mL (ref 11–307)

## 2018-12-27 MED ORDER — POTASSIUM CHLORIDE CRYS ER 20 MEQ PO TBCR
40.0000 meq | EXTENDED_RELEASE_TABLET | ORAL | Status: AC
  Administered 2018-12-27 – 2018-12-28 (×3): 40 meq via ORAL
  Filled 2018-12-27 (×2): qty 2

## 2018-12-27 MED ORDER — DEXTROSE IN LACTATED RINGERS 5 % IV SOLN: INTRAVENOUS | Status: DC

## 2018-12-27 NOTE — Plan of Care (Signed)

## 2018-12-27 NOTE — Progress Notes (Addendum)
Pt confused and refusing medications on first attempt. 2nd was successful. Refused breakfast this am. Will continue to monitor.

## 2018-12-27 NOTE — Progress Notes (Signed)
PROGRESS NOTE    Whitney Pearson  UEA:[id number] DOB: 1933-12-04 DOA: 12/23/2018 PCP: System, Pcp Not In   Brief Narrative:  Whitney Pearson is Whitney Pearson 85 y.o. female with medical history significant of dementia sent by snf for hypoxia.  No history available and pt cannot provide any due to her dementia.  Found to have covid pna with sats in the 73% range on RA.  Appears comfortable.  Referred for admission for covid pna.  She was admitted for AHRF 2/2 COVID Pneumonia.  She's improved with steroids and remdesivir.  She had elevated troponin, thought to be 2/2 demand ischemia vs myocarditis or NSTEMI.  Pt daughter desired conservative management and case was discussed with cardiology who recommended asa, statin, beta blocker.  She's improved and has been stably confused.  Discharge pending last dose of remdesivir on 11/11.   Assessment & Plan:   Principal Problem:   Pneumonia due to COVID-19 virus Active Problems:   Acute respiratory failure due to COVID-19 [facility])  COVID 19 Viral Infection   Diarrhea:  Reportedly with O2 sats 73 on presentation Currently satting well on RA Remdesivir to complete on 11/11, steroids (will d/c steroids as pt now on RA) CT chest at [place] without central/lobar PE (limited exam), consolidation and volume loss in LLL with heterogenous attenuation concerning for underlying infection. CXR 11/8 with LLL hazy airspace disease - atelectasis vs pneumonia Daily labs -> CBC, CMP, CRP, ferritin, D dimer - improving I/O, daily weights Positive COVID 19 testing documented at [place]    12/25/18 0218 12/26/18 0515 12/27/18 0910  DDIMER 0.56* 0.46 0.40  FERRITIN 132 152 166  CRP 2.3* 2.0* 0.8    No results found for: SARSCOV2NAA  Elevated Troponin: possibly related to COVID 19 infection with demand ischemia vs myocarditis vs NSTEMI.  Troponin downtrending.  Discussed with daughter who was agreeable with conservative  management.   Discussed with cards, recommending ASA, statin, beta blocker. Echo with EF 47-82%, grade 2 diastolic dysfunction (see report) - no reported WMA LDL 154, A1c 6.2  Prediabetes: A1c 6.2.  continue SSI, fasting BG reasonable at this time.  Follow.   Dementia: delirium precautions, pt is impulsive, tele sitter ordered Continue depakote and zoloft  IBS: diarrhea could be 2/2 IBS, though seems worse than this - continue viberzi  Hypokalemia: replace and follow - follow mag  Hypothyroidism: continue synthroid  5 mm nodule in R major fissure: follow outpatient  1.6 CM lesion upper pole L kidney: will obtain US while she's here  CT chest [place] IMPRESSION: 1. Evaluation beyond the lobar level is limited due to respiratory motion artifact which is most pronounced in the lung bases. No central or lobar pulmonary emboli are seen 2. Focal consolidation and volume loss in the left lower lobe with heterogeneous attenuation suspicious for an underlying infection in the setting of known COVID-19 positivity. 3. Mild central pulmonary artery enlargement without elevation of the RV/LV ratio, suggestive of underlying pulmonary arterial hypertension. 4. Few calcified granulomata are present 5. 60mm solid nodule present right major fissure. No follow-up needed if patient is low-risk. Non-contrast chest CT can be considered in 12 months if patient is high-risk. This recommendation follows the consensus statement: Guidelines for Management of Incidental Pulmonary Nodules Detected on CT Images: From the Fleischner Society 2017; Radiology 2017; 284:228-243. 6. Indeterminate 1.6 cm intermediate attenuation lesion in upper pole left kidney, which could represent Layten Aiken proteinaceous/hemorrhagic cyst or solid mass. Further evaluation with  ultrasound is recommended. 7. Aortic Atherosclerosis (ICD10-I70.0).  DVT prophylaxis: lovenox Code Status: DNR Family Communication: discussed with  duaghter 11/9 Disposition Plan: pending further improvement, completion of remdesivir   Consultants:   Cardiology over phone  Procedures:  Echo IMPRESSIONS    1. Left ventricular ejection fraction, by visual estimation, is 55 to 60%. The left ventricle has normal function. There is no left ventricular hypertrophy.  2. Elevated left atrial pressure.  3. Left ventricular diastolic parameters are consistent with Grade II diastolic dysfunction (pseudonormalization).  4. Global right ventricle has normal systolic function.The right ventricular size is normal. No increase in right ventricular wall thickness.  5. Left atrial size was normal.  6. Right atrial size was normal.  7. Mild mitral annular calcification.  8. The mitral valve is normal in structure. Trace mitral valve regurgitation. No evidence of mitral stenosis.  9. The tricuspid valve is normal in structure. Tricuspid valve regurgitation is mild. 10. The aortic valve is tricuspid. Aortic valve regurgitation is not visualized. No evidence of aortic valve sclerosis or stenosis. 11. The pulmonic valve was not well visualized. Pulmonic valve regurgitation is not visualized. 12. The interatrial septum was not well visualized.  Antimicrobials:  Anti-infectives (From admission, onward)   Start     Dose/Rate Route Frequency Ordered Stop   12/25/18 1000  remdesivir 100 mg in sodium chloride 0.9 % 250 mL IVPB     100 mg 500 mL/hr over 30 Minutes Intravenous Every 24 hours 12/24/18 0517 12/29/18 0959   12/24/18 0600  remdesivir 200 mg in sodium chloride 0.9 % 250 mL IVPB     200 mg 500 mL/hr over 30 Minutes Intravenous Once 12/24/18 0517 12/24/18 1147     Subjective: Whitney Pearson&Ox1 No complaints  Objective: Vitals:   12/26/18 1908 12/27/18 0450 12/27/18 0800 12/27/18 0938  BP: (!) 144/94 (!) 150/67 (!) 173/91 (!) 151/89  Pulse: (!) 58 (!) 43 65 62  Resp: 18 18 18    Temp: 98.3 F (36.8 C) 98.1 F (36.7 C) 98.1 F (36.7 C)     TempSrc:   Oral Oral  SpO2: 99% 98% 96% 97%  Weight:      Height:        Intake/Output Summary (Last 24 hours) at 12/27/2018 1338 Last data filed at 12/27/2018 0400 Gross per 24 hour  Intake --  Output 400 ml  Net -400 ml   Filed Weights   12/23/18 2301  Weight: 69 kg    Examination:  General: No acute distress. Cardiovascular: RRR Lungs: unlabored, no increased WOB Abdomen: Soft, nontender, nondistended  Neurological: Alert and oriented 1. Moves all extremities 4. Cranial nerves II through XII grossly intact. Skin: Warm and dry. No rashes or lesions. Extremities: No clubbing or cyanosis. No edema.    Data Reviewed: I have personally reviewed following labs and imaging studies  CBC: Recent Labs  Lab 12/24/18 0238 12/25/18 0218 12/26/18 0515 12/27/18 0910  WBC 5.8 5.7 4.8 6.8  NEUTROABS 4.3 3.8 1.6* 5.4  HGB 12.8 12.5 14.6 15.4*  HCT 39.8 39.1 46.3* 47.4*  MCV 92.3 91.6 92.6 90.5  PLT 228 235 257 289   Basic Metabolic Panel: Recent Labs  Lab 12/24/18 0238 12/24/18 0412 12/24/18 1302 12/25/18 0218 12/26/18 0515 12/27/18 0910  NA 142  --  143 143 143 138  K 2.2*  --  3.2* 3.0* 3.1* 3.0*  CL 102  --  104 105 105 101  CO2 26  --  25 25 27 26   GLUCOSE  138*  --  89 120* 109* 166*  BUN 14  --  13 13 18 20   CREATININE 0.85  --  0.75 0.88 0.75 0.68  CALCIUM 8.5*  --  8.4* 8.8* 9.1 8.9  MG  --  1.9  --   --  1.9  --    GFR: Estimated Creatinine Clearance: 52 mL/min (by C-G formula based on SCr of 0.68 mg/dL). Liver Function Tests: Recent Labs  Lab 12/24/18 0238 12/25/18 0218 12/26/18 0515 12/27/18 0910  AST 53* 47* 41 39  ALT 17 16 15 22   ALKPHOS 59 57 62 66  BILITOT 0.4 0.4 0.5 0.8  PROT 7.3 7.3 8.3* 8.1  ALBUMIN 3.3* 3.3* 3.7 3.7   No results for input(s): LIPASE, AMYLASE in the last 168 hours. No results for input(s): AMMONIA in the last 168 hours. Coagulation Profile: No results for input(s): INR, PROTIME in the last 168 hours. Cardiac  Enzymes: No results for input(s): CKTOTAL, CKMB, CKMBINDEX, TROPONINI in the last 168 hours. BNP (last 3 results) No results for input(s): PROBNP in the last 8760 hours. HbA1C: No results for input(s): HGBA1C in the last 72 hours. CBG: Recent Labs  Lab 12/26/18 0813 12/26/18 1120 12/26/18 1629 12/26/18 2044 12/27/18 1120  GLUCAP 152* 206* 81 121* 168*   Lipid Profile: No results for input(s): CHOL, HDL, LDLCALC, TRIG, CHOLHDL, LDLDIRECT in the last 72 hours. Thyroid Function Tests: No results for input(s): TSH, T4TOTAL, FREET4, T3FREE, THYROIDAB in the last 72 hours. Anemia Panel: Recent Labs    12/26/18 0515 12/27/18 0910  FERRITIN 152 166   Sepsis Labs: Recent Labs  Lab 12/24/18 0238  PROCALCITON <0.10    No results found for this or any previous visit (from the past 240 hour(s)).       Radiology Studies: No results found.      Scheduled Meds:  aspirin  81 mg Oral Daily   atorvastatin  40 mg Oral q1800   divalproex  125 mg Oral BID   Eluxadoline  75 mg Oral BID WC   enoxaparin (LOVENOX) injection  40 mg Subcutaneous Q24H   insulin aspart  0-5 Units Subcutaneous QHS   insulin aspart  0-9 Units Subcutaneous TID WC   levothyroxine  75 mcg Oral QAC breakfast   metoprolol tartrate  12.5 mg Oral BID   potassium chloride  40 mEq Oral Q4H   sertraline  50 mg Oral Daily   sodium chloride flush  3 mL Intravenous Q12H   vitamin C  500 mg Oral Daily   zinc sulfate  220 mg Oral Daily   Continuous Infusions:  sodium chloride     remdesivir 100 mg in NS 250 mL 100 mg (12/27/18 0934)     LOS: 4 days    Time spent: over 30 min    Lacretia Nicksaldwell Powell, MD [facility] Pager AMION  If 7PM-7AM, please contact night-coverage [email or web address] Password [email or web address] 12/27/2018, 1:38 PM

## 2018-12-28 DIAGNOSIS — R935 Abnormal findings on diagnostic imaging of other abdominal regions, including retroperitoneum: Secondary | ICD-10-CM

## 2018-12-28 DIAGNOSIS — J9600 Acute respiratory failure, unspecified whether with hypoxia or hypercapnia: Secondary | ICD-10-CM

## 2018-12-28 LAB — COMPREHENSIVE METABOLIC PANEL
ALT: 21 U/L (ref 0–44)
AST: 37 U/L (ref 15–41)
Albumin: 3.4 g/dL — ABNORMAL LOW (ref 3.5–5.0)
Alkaline Phosphatase: 63 U/L (ref 38–126)
Anion gap: 11 (ref 5–15)
BUN: 26 mg/dL — ABNORMAL HIGH (ref 8–23)
CO2: 25 mmol/L (ref 22–32)
Calcium: 8.9 mg/dL (ref 8.9–10.3)
Chloride: 103 mmol/L (ref 98–111)
Creatinine, Ser: 0.91 mg/dL (ref 0.44–1.00)
GFR calc Af Amer: >60 mL/min (ref >60)
GFR calc non Af Amer: 58 mL/min — ABNORMAL LOW (ref >60)
Glucose, Bld: 88 mg/dL (ref 70–99)
Potassium: 3.6 mmol/L (ref 3.5–5.1)
Sodium: 139 mmol/L (ref 135–145)
Total Bilirubin: 0.5 mg/dL (ref 0.3–1.2)
Total Protein: 7.6 g/dL (ref 6.5–8.1)

## 2018-12-28 LAB — GLUCOSE, CAPILLARY
Glucose-Capillary: 116 mg/dL — ABNORMAL HIGH (ref 70–99)
Glucose-Capillary: 108 mg/dL — ABNORMAL HIGH (ref 70–99)
Glucose-Capillary: 132 mg/dL — ABNORMAL HIGH (ref 70–99)
Glucose-Capillary: 121 mg/dL — ABNORMAL HIGH (ref 70–99)

## 2018-12-28 LAB — CBC WITH DIFFERENTIAL/PLATELET
Abs Immature Granulocytes: 0.03 10*3/uL (ref 0.00–0.07)
Basophils Absolute: 0 10*3/uL (ref 0.0–0.1)
Basophils Relative: 0 %
Eosinophils Absolute: 0 10*3/uL (ref 0.0–0.5)
Eosinophils Relative: 0 %
HCT: 45.6 % (ref 36.0–46.0)
Hemoglobin: 14.5 g/dL (ref 12.0–15.0)
Immature Granulocytes: 0 %
Lymphocytes Relative: 40 %
Lymphs Abs: 3.3 10*3/uL (ref 0.7–4.0)
MCH: 29.1 pg (ref 26.0–34.0)
MCHC: 31.8 g/dL (ref 30.0–36.0)
MCV: 91.6 fL (ref 80.0–100.0)
Monocytes Absolute: 0.6 10*3/uL (ref 0.1–1.0)
Monocytes Relative: 7 %
Neutro Abs: 4.3 10*3/uL (ref 1.7–7.7)
Neutrophils Relative %: 53 %
Platelets: 307 10*3/uL (ref 150–400)
RBC: 4.98 MIL/uL (ref 3.87–5.11)
RDW: 14.4 % (ref 11.5–15.5)
WBC: 8.3 10*3/uL (ref 4.0–10.5)
nRBC: 0 % (ref 0.0–0.2)

## 2018-12-28 LAB — D-DIMER, QUANTITATIVE: D-Dimer, Quant: 0.44 ug/mL-FEU (ref 0.00–0.50)

## 2018-12-28 LAB — C-REACTIVE PROTEIN: CRP: <0.8 mg/dL (ref <1.0)

## 2018-12-28 MED ORDER — ALBUTEROL SULFATE HFA 108 (90 BASE) MCG/ACT IN AERS: 2.0000 | INHALATION_SPRAY | Freq: Four times a day (QID) | RESPIRATORY_TRACT | 0 refills | Status: AC | PRN

## 2018-12-28 NOTE — NC FL2 (Signed)
[place] MEDICAID FL2 LEVEL OF CARE SCREENING TOOL     IDENTIFICATION  Patient Name: Whitney Pearson Birthdate: May 24, 1933 Sex: female Admission Date (Current Location): 12/23/2018  [facility] and [place] Number:  Producer, television/film/video and Address:  [facility], [address], [place], [place] [address]([address])      Provider Number:    Attending Physician Name and Address:  Maretta Bees, MD  Relative Name and Phone Number:  Daughter: Maylon Peppers [phone]    Current Level of Care: Hospital Recommended Level of Care: Memory Care Prior Approval Number:    Date Approved/Denied:   PASRR Number: [id number]  Discharge Plan: Other (Comment)(return to [facility]    Current Diagnoses: Patient Active Problem List   Diagnosis Date Noted  . Pneumonia due to COVID-19 virus 12/24/2018  . Acute respiratory failure due to COVID-19 (HCC) 12/24/2018    Orientation RESPIRATION BLADDER Height & Weight     Self(has dementia)  Normal Incontinent Weight: 69 kg Height:  5\' 5"  (165.1 cm)  BEHAVIORAL SYMPTOMS/MOOD NEUROLOGICAL BOWEL NUTRITION STATUS      Incontinent    AMBULATORY STATUS COMMUNICATION OF NEEDS Skin   Extensive Assist Verbally Normal                       Personal Care Assistance Level of Assistance  Total care       Total Care Assistance: Maximum assistance   Functional Limitations Info             SPECIAL CARE FACTORS FREQUENCY  PT (By licensed PT), OT (By licensed OT)     PT Frequency: 5x/week OT Frequency: min3x/week            Contractures Contractures Info: Not present    Additional Factors Info  Code Status Code Status Info: DNR             Current Medications (12/28/2018):  This is the current hospital active medication list Current Facility-Administered Medications  Medication Dose Route Frequency Provider Last Rate Last Dose  . 0.9 %  sodium chloride  infusion  250 mL Intravenous PRN 13/12/2018, MD      . acetaminophen (TYLENOL) tablet 650 mg  650 mg Oral Q6H PRN Haydee Monica, MD   650 mg at 12/25/18 1954  . aspirin chewable tablet 81 mg  81 mg Oral Daily 13/08/20, MD   81 mg at 12/28/18 13/11/20  . atorvastatin (LIPITOR) tablet 40 mg  40 mg Oral q1800 4888., MD   40 mg at 12/27/18 1721  . chlorpheniramine-HYDROcodone (TUSSIONEX) 10-8 MG/5ML suspension 5 mL  5 mL Oral Q12H PRN 13/10/20, MD   5 mL at 12/26/18 2120  . divalproex (DEPAKOTE SPRINKLE) capsule 125 mg  125 mg Oral BID 2121., MD   125 mg at 12/28/18 0908  . Eluxadoline TABS 75 mg  75 mg Oral BID WC 13/11/20., MD   75 mg at 12/28/18 0908  . enoxaparin (LOVENOX) injection 40 mg  40 mg Subcutaneous Q24H 13/11/20 A, MD   40 mg at 12/28/18 0425  . guaiFENesin-dextromethorphan (ROBITUSSIN DM) 100-10 MG/5ML syrup 10 mL  10 mL Oral Q4H PRN 13/11/20 A, MD      . insulin aspart (novoLOG) injection 0-5 Units  0-5 Units Subcutaneous QHS Tarry Kos., MD      . insulin  aspart (novoLOG) injection 0-9 Units  0-9 Units Subcutaneous TID WC Elodia Florence., MD   1 Units at 12/28/18 1252  . levothyroxine (SYNTHROID) tablet 75 mcg  75 mcg Oral QAC breakfast Elodia Florence., MD   75 mcg at 12/28/18 0425  . metoprolol tartrate (LOPRESSOR) tablet 12.5 mg  12.5 mg Oral BID Elodia Florence., MD   12.5 mg at 12/28/18 0909  . ondansetron (ZOFRAN) tablet 4 mg  4 mg Oral Q6H PRN Phillips Grout, MD       Or  . ondansetron [facility]) injection 4 mg  4 mg Intravenous Q6H PRN Phillips Grout, MD      . sertraline (ZOLOFT) tablet 50 mg  50 mg Oral Daily Elodia Florence., MD   50 mg at 12/28/18 0910  . sodium chloride flush (NS) 0.9 % injection 3 mL  3 mL Intravenous Q12H Derrill Kay A, MD   3 mL at 12/28/18 0911  . sodium chloride flush (NS) 0.9 % injection 3 mL  3 mL Intravenous PRN Derrill Kay A, MD       . vitamin C (ASCORBIC ACID) tablet 500 mg  500 mg Oral Daily Derrill Kay A, MD   500 mg at 12/28/18 0910  . zinc sulfate capsule 220 mg  220 mg Oral Daily Phillips Grout, MD   220 mg at 12/28/18 2836     Discharge Medications: Please see discharge summary for a list of discharge medications.  Relevant Imaging Results:  Relevant Lab Results:   Additional Information SS#: [id number]  Ninfa Meeker, RN

## 2018-12-28 NOTE — Care Management Important Message (Signed)
Important Message  Patient Details  Name: Whitney Pearson MRN: [id number] Date of Birth: March 25, 1933   Medicare Important Message Given:  Yes - Important Message mailed due to current National Emergency  Verbal consent obtained due to current National Emergency  Relationship to patient: Child Contact Name: Neena Rhymes Call Date: 12/28/18  Time: 1500 Phone: [phone] Outcome: Spoke with contact Important Message mailed to: Patient address on file    Delorse Lek 12/28/2018, 3:21 PM

## 2018-12-28 NOTE — Discharge Summary (Signed)
PATIENT DETAILS Name: Whitney Pearson Age: 85 y.o. Sex: female Date of Birth: 01-11-1934 MRN: [id number]. Admitting Physician: Haydee Monica, MD WUJ:[id number], Pcp Not In  Admit Date: 12/23/2018 Discharge date: 12/28/2018  Recommendations for Outpatient Follow-up:  1. Follow up with PCP in 1-2 weeks 2. Please obtain BMP/CBC in one week 3. As incidental finding of right lung nodule, left lung lesion-see below 4. Suspect will benefit from palliative care evaluation at SNF-we will defer to PCP.   Admitted From:  SNF  Disposition: SNF   Home Health: No  Equipment/Devices: None  Discharge Condition: Stable  CODE STATUS:  DNR  Diet recommendation:  Diet Order            Diet - low sodium heart healthy        Diet Carb Modified        Diet Heart Room service appropriate? Yes; Fluid consistency: Thin  Diet effective now               Brief Summary: See H&P, Labs, Consult and Test reports for all details in brief,Patient is a 85 y.o. female with PMHx of dementia, hypothyroidism-SNF resident-sent to ED for evaluation of hypoxemia-subsequent work-up revealed acute hypoxic respiratory failure secondary to COVID-19.  See below for further details.  Brief Hospital Course: Acute Hypoxic Resp Failure due to Covid 19 Viral pneumonia: Improved-now on room air.  Will finish remdesivir 11/11.  No longer on steroids as patient on room air.  COVID-19 Medications: Steroids: 11/6>>11/9 Remdesivir: 11/7>>11/11 Actemra: Not given Convalescent Plasma: Not given  research Studies:N/A  Elevated troponin: Trend is flat-not consistent with ACS-likely secondary demand ischemia.  Prior hospitalist MD spoke with cardiology who recommended aspirin, statin and beta-blocker.  Echo with preserved EF and without any wall motion abnormalities.  Prediabetes (A1c 6.2): CBGs stable with SSI-will not plan on discharging on any particular therapy given advanced age and frailty.   Dementia: Pleasantly confused-continue Depakote and Zoloft  IBS: Appears stable-  Hypothyroidism: Continue Synthroid  5 mm right lung nodule: Seen incidentally on CT chest done at [facility] for outpatient follow-up. Findings was discussed with patient's daughter on the phone on 11/11-she will follow up with patient's PCP.  1.6 cm lesion in upper pole left kidney: Seen incidentally on CT chest done at [facility] kidney was not visualized on renal ultrasound due to body habitus and bowel gas.  Further work-up deferred to the outpatient setting.  Findings was discussed with patient's daughter on the phone on 11/11-she will follow up with patient's PCP.  Procedures/Studies: None  Discharge Diagnoses:  Principal Problem:   Pneumonia due to COVID-19 virus Active Problems:   Acute respiratory failure due to COVID-19 [facility])   Discharge Instructions:  Activity:  As tolerated with Full fall precautions use walker/cane & assistance as needed   Discharge Instructions    Diet - low sodium heart healthy   Complete by: As directed    Diet Carb Modified   Complete by: As directed    Increase activity slowly   Complete by: As directed      Allergies as of 12/28/2018   Not on File     Medication List    STOP taking these medications   ipratropium-albuterol 0.5-2.5 (3) MG/3ML Soln Commonly known as: DUONEB     TAKE these medications   acetaminophen 325 MG tablet Commonly known as: TYLENOL Take 650 mg by mouth every 6 (six) hours as needed for mild pain or fever.   albuterol  108 (90 Base) MCG/ACT inhaler Commonly known as: VENTOLIN HFA Inhale 2 puffs into the lungs every 6 (six) hours as needed for wheezing or shortness of breath.   Align 4 MG Caps Take 4 mg by mouth daily.   ALIGN PREBIOTIC-PROBIOTIC PO Take 1 capsule by mouth daily.   aluminum-magnesium hydroxide-simethicone 200-200-20 MG/5ML Susp Commonly known as: MAALOX Take 30 mLs by  mouth every 4 (four) hours as needed (heartburn).   Anti-Nausea 1.87-1.87-21.5 Soln Take 15 mLs by mouth as needed (nausea).   Baza Clear Oint Apply 1 application topically as needed (apply to buttocks).   budesonide 0.25 MG/2ML nebulizer solution Commonly known as: PULMICORT Take 0.25 mg by nebulization 2 (two) times daily.   calcium-vitamin D 500-200 MG-UNIT tablet Commonly known as: OSCAL WITH D Take 1 tablet by mouth.   clotrimazole 1 % cream Commonly known as: LOTRIMIN Apply 1 application topically as needed (apply to toes after bath prn).   divalproex 125 MG capsule Commonly known as: DEPAKOTE SPRINKLE Take 250 mg by mouth 2 (two) times daily.   Ensure Take 237 mLs by mouth 4 (four) times daily.   fexofenadine 180 MG tablet Commonly known as: ALLEGRA Take 180 mg by mouth daily.   guaiFENesin-dextromethorphan 100-10 MG/5ML syrup Commonly known as: ROBITUSSIN DM Take 10 mLs by mouth every 6 (six) hours as needed for cough.   levothyroxine 75 MCG tablet Commonly known as: SYNTHROID Take 75 mcg by mouth daily before breakfast.   nitroGLYCERIN 0.4 MG SL tablet Commonly known as: NITROSTAT Place 0.4 mg under the tongue every 5 (five) minutes as needed for chest pain.   nystatin powder Commonly known as: MYCOSTATIN/NYSTOP Apply 1 g topically daily. On non bath days   nystatin cream Commonly known as: MYCOSTATIN Apply 1 application topically 2 (two) times daily as needed for dry skin (apply to groin area bid prn).   oxymetazoline 0.05 % nasal spray Commonly known as: AFRIN Place 1 spray into both nostrils 2 (two) times daily as needed for congestion.   pimecrolimus 1 % cream Commonly known as: ELIDEL Apply 1 application topically at bedtime. apply to face   potassium chloride 10 MEQ tablet Commonly known as: KLOR-CON Take 10 mEq by mouth daily.   sertraline 50 MG tablet Commonly known as: ZOLOFT Take 50 mg by mouth daily.   Viberzi 75 MG Tabs Generic  drug: Eluxadoline Take 75 mg by mouth 2 (two) times daily with a meal.   Vitamin D (Cholecalciferol) 50 MCG (2000 UT) Caps Take 2,000 Units by mouth daily.       Not on File  Consultations:   None   Other Procedures/Studies: [place] Renal  Result Date: 12/27/2018 CLINICAL DATA:  Abnormal CT showing a 1.6 cm LEFT renal lesion EXAM: RENAL / URINARY TRACT ULTRASOUND COMPLETE COMPARISON:  CT angio chest 12/22/2018 FINDINGS: Right Kidney: Renal measurements: 9.6 x 4.6 x 5.5 cm = volume: 127 mL. Cortical thinning. Normal cortical echogenicity. No mass, hydronephrosis or shadowing calcification. Left Kidney: Renal measurements: 9.3 cm length. Remaining measurements are suboptimal due to severely limited visualization secondary to body habitus and bowel gas. No gross renal mass or hydronephrosis are identified. Bladder: Well distended, normal appearance Other: None. IMPRESSION: Unremarkable RIGHT kidney. Inadequately visualized LEFT kidney due to body habitus and bowel gas. Lesion identified by CT in the LEFT kidney is not adequately seen for characterization; either characterization by MR imaging with and without contrast or by multiphase CT imaging with and without contrast recommended for further  characterization. Electronically Signed   By: Ulyses Southward M.D.   On: 12/27/2018 17:08   Dg Chest Port 1 View  Result Date: 12/25/2018 CLINICAL DATA:  Hypoxia EXAM: PORTABLE CHEST 1 VIEW COMPARISON:  Chest x-ray 12/22/2018 CT chest 12/22/2018 FINDINGS: There is left lower lobe airspace disease. There is no pleural effusion or pneumothorax. The right lung is clear. The heart and mediastinal contours are unremarkable. There is no acute osseous abnormality. IMPRESSION: Left lower lobe hazy airspace disease which may reflect atelectasis versus pneumonia. Electronically Signed   By: Elige Ko   On: 12/25/2018 14:04     TODAY-DAY OF DISCHARGE:  Subjective:   Whitney Pearson today remains pleasantly  confused.  Objective:   Blood pressure 101/66, pulse 63, temperature 97.8 F (36.6 C), temperature source Axillary, resp. rate 18, height 5\' 5"  (1.651 m), weight 69 kg, SpO2 98 %.  Intake/Output Summary (Last 24 hours) at 12/28/2018 1401 Last data filed at 12/27/2018 2100 Gross per 24 hour  Intake -  Output 700 ml  Net -700 ml   Filed Weights   12/23/18 2301  Weight: 69 kg    Exam: Pleasantly confused, No new F.N deficits, Normal affect [place].AT,PERRAL Supple Neck,No JVD, No cervical lymphadenopathy appriciated.  Symmetrical Chest wall movement, Good air movement bilaterally, CTAB RRR,No Gallops,Rubs or new Murmurs, No Parasternal Heave +ve B.Sounds, Abd Soft, Non tender, No organomegaly appriciated, No rebound -guarding or rigidity. No Cyanosis, Clubbing or edema, No new Rash or bruise   PERTINENT RADIOLOGIC STUDIES: 13/06/20 Renal  Result Date: 12/27/2018 CLINICAL DATA:  Abnormal CT showing a 1.6 cm LEFT renal lesion EXAM: RENAL / URINARY TRACT ULTRASOUND COMPLETE COMPARISON:  CT angio chest 12/22/2018 FINDINGS: Right Kidney: Renal measurements: 9.6 x 4.6 x 5.5 cm = volume: 127 mL. Cortical thinning. Normal cortical echogenicity. No mass, hydronephrosis or shadowing calcification. Left Kidney: Renal measurements: 9.3 cm length. Remaining measurements are suboptimal due to severely limited visualization secondary to body habitus and bowel gas. No gross renal mass or hydronephrosis are identified. Bladder: Well distended, normal appearance Other: None. IMPRESSION: Unremarkable RIGHT kidney. Inadequately visualized LEFT kidney due to body habitus and bowel gas. Lesion identified by CT in the LEFT kidney is not adequately seen for characterization; either characterization by MR imaging with and without contrast or by multiphase CT imaging with and without contrast recommended for further characterization. Electronically Signed   By: 13/06/2018 M.D.   On: 12/27/2018 17:08   Dg Chest Port 1  View  Result Date: 12/25/2018 CLINICAL DATA:  Hypoxia EXAM: PORTABLE CHEST 1 VIEW COMPARISON:  Chest x-ray 12/22/2018 CT chest 12/22/2018 FINDINGS: There is left lower lobe airspace disease. There is no pleural effusion or pneumothorax. The right lung is clear. The heart and mediastinal contours are unremarkable. There is no acute osseous abnormality. IMPRESSION: Left lower lobe hazy airspace disease which may reflect atelectasis versus pneumonia. Electronically Signed   By: 13/06/2018   On: 12/25/2018 14:04     PERTINENT LAB RESULTS: CBC: Recent Labs    12/27/18 0910 12/28/18 0205  WBC 6.8 8.3  HGB 15.4* 14.5  HCT 47.4* 45.6  PLT 289 307   CMET CMP     Component Value Date/Time   NA 139 12/28/2018 0205   K 3.6 12/28/2018 0205   CL 103 12/28/2018 0205   CO2 25 12/28/2018 0205   GLUCOSE 88 12/28/2018 0205   BUN 26 (H) 12/28/2018 0205   CREATININE 0.91 12/28/2018 0205   CALCIUM 8.9 12/28/2018  0205   PROT 7.6 12/28/2018 0205   ALBUMIN 3.4 (L) 12/28/2018 0205   AST 37 12/28/2018 0205   ALT 21 12/28/2018 0205   ALKPHOS 63 12/28/2018 0205   BILITOT 0.5 12/28/2018 0205   GFRNONAA 58 (L) 12/28/2018 0205   GFRAA >60 12/28/2018 0205    GFR Estimated Creatinine Clearance: 45.7 mL/min (by C-G formula based on SCr of 0.91 mg/dL). No results for input(s): LIPASE, AMYLASE in the last 72 hours. No results for input(s): CKTOTAL, CKMB, CKMBINDEX, TROPONINI in the last 72 hours. Invalid input(s): POCBNP Recent Labs    12/27/18 0910 12/28/18 0205  DDIMER 0.40 0.44   No results for input(s): HGBA1C in the last 72 hours. No results for input(s): CHOL, HDL, LDLCALC, TRIG, CHOLHDL, LDLDIRECT in the last 72 hours. No results for input(s): TSH, T4TOTAL, T3FREE, THYROIDAB in the last 72 hours.  Invalid input(s): FREET3 Recent Labs    12/26/18 0515 12/27/18 0910  FERRITIN 152 166   Coags: No results for input(s): INR in the last 72 hours.  Invalid input(s): PT Microbiology: No  results found for this or any previous visit (from the past 240 hour(s)).  FURTHER DISCHARGE INSTRUCTIONS:  Get Medicines reviewed and adjusted: Please take all your medications with you for your next visit with your Primary MD  Laboratory/radiological data: Please request your Primary MD to go over all hospital tests and procedure/radiological results at the follow up, please ask your Primary MD to get all Hospital records sent to his/her office.  In some cases, they will be blood work, cultures and biopsy results pending at the time of your discharge. Please request that your primary care M.D. goes through all the records of your hospital data and follows up on these results.  Also Note the following: If you experience worsening of your admission symptoms, develop shortness of breath, life threatening emergency, suicidal or homicidal thoughts you must seek medical attention immediately by calling 911 or calling your MD immediately  if symptoms less severe.  You must read complete instructions/literature along with all the possible adverse reactions/side effects for all the Medicines you take and that have been prescribed to you. Take any new Medicines after you have completely understood and accpet all the possible adverse reactions/side effects.   Do not drive when taking Pain medications or sleeping medications (Benzodaizepines)  Do not take more than prescribed Pain, Sleep and Anxiety Medications. It is not advisable to combine anxiety,sleep and pain medications without talking with your primary care practitioner  Special Instructions: If you have smoked or chewed Tobacco  in the last 2 yrs please stop smoking, stop any regular Alcohol  and or any Recreational drug use.  Wear Seat belts while driving.  Please note: You were cared for by a hospitalist during your hospital stay. Once you are discharged, your primary care physician will handle any further medical issues. Please note that  NO REFILLS for any discharge medications will be authorized once you are discharged, as it is imperative that you return to your primary care physician (or establish a relationship with a primary care physician if you do not have one) for your post hospital discharge needs so that they can reassess your need for medications and monitor your lab values.  Total Time spent coordinating discharge including counseling, education and face to face time equals 35 minutes.  SignedOren Binet 12/28/2018 2:01 PM

## 2018-12-28 NOTE — Plan of Care (Signed)
  Problem: Education: Goal: Knowledge of risk factors and measures for prevention of condition will improve 12/28/2018 1512 by Levie Heritage, RN Outcome: Adequate for Discharge 12/28/2018 1017 by Levie Heritage, RN Outcome: Progressing   Problem: Coping: Goal: Psychosocial and spiritual needs will be supported 12/28/2018 1512 by Levie Heritage, RN Outcome: Adequate for Discharge 12/28/2018 1017 by Levie Heritage, RN Outcome: Progressing   Problem: Respiratory: Goal: Will maintain a patent airway 12/28/2018 1512 by Levie Heritage, RN Outcome: Adequate for Discharge 12/28/2018 1017 by Levie Heritage, RN Outcome: Progressing Goal: Complications related to the disease process, condition or treatment will be avoided or minimized 12/28/2018 1512 by Levie Heritage, RN Outcome: Adequate for Discharge 12/28/2018 1017 by Levie Heritage, RN Outcome: Progressing   Problem: Education: Goal: Knowledge of General Education information will improve Description: Including pain rating scale, medication(s)/side effects and non-pharmacologic comfort measures 12/28/2018 1512 by Levie Heritage, RN Outcome: Adequate for Discharge 12/28/2018 1017 by Levie Heritage, RN Outcome: Progressing   Problem: Health Behavior/Discharge Planning: Goal: Ability to manage health-related needs will improve 12/28/2018 1512 by Levie Heritage, RN Outcome: Adequate for Discharge 12/28/2018 1017 by Levie Heritage, RN Outcome: Progressing   Problem: Clinical Measurements: Goal: Ability to maintain clinical measurements within normal limits will improve 12/28/2018 1512 by Levie Heritage, RN Outcome: Adequate for Discharge 12/28/2018 1017 by Levie Heritage, RN Outcome: Progressing Goal: Will remain free from infection 12/28/2018 1512 by Levie Heritage, RN Outcome: Adequate for Discharge 12/28/2018 1017 by Levie Heritage, RN Outcome: Progressing Goal: Diagnostic test results will improve 12/28/2018 1512 by Levie Heritage,  RN Outcome: Adequate for Discharge 12/28/2018 1017 by Levie Heritage, RN Outcome: Progressing Goal: Respiratory complications will improve 12/28/2018 1512 by Levie Heritage, RN Outcome: Adequate for Discharge 12/28/2018 1017 by Levie Heritage, RN Outcome: Progressing Goal: Cardiovascular complication will be avoided 12/28/2018 1512 by Levie Heritage, RN Outcome: Adequate for Discharge 12/28/2018 1017 by Levie Heritage, RN Outcome: Progressing   Problem: Activity: Goal: Risk for activity intolerance will decrease 12/28/2018 1512 by Levie Heritage, RN Outcome: Adequate for Discharge 12/28/2018 1017 by Levie Heritage, RN Outcome: Progressing   Problem: Nutrition: Goal: Adequate nutrition will be maintained 12/28/2018 1512 by Levie Heritage, RN Outcome: Adequate for Discharge 12/28/2018 1017 by Levie Heritage, RN Outcome: Progressing   Problem: Coping: Goal: Level of anxiety will decrease 12/28/2018 1512 by Levie Heritage, RN Outcome: Adequate for Discharge 12/28/2018 1017 by Levie Heritage, RN Outcome: Progressing   Problem: Elimination: Goal: Will not experience complications related to bowel motility 12/28/2018 1512 by Levie Heritage, RN Outcome: Adequate for Discharge 12/28/2018 1017 by Levie Heritage, RN Outcome: Progressing Goal: Will not experience complications related to urinary retention 12/28/2018 1512 by Levie Heritage, RN Outcome: Adequate for Discharge 12/28/2018 1017 by Levie Heritage, RN Outcome: Progressing   Problem: Pain Managment: Goal: General experience of comfort will improve 12/28/2018 1512 by Levie Heritage, RN Outcome: Adequate for Discharge 12/28/2018 1017 by Levie Heritage, RN Outcome: Progressing   Problem: Safety: Goal: Ability to remain free from injury will improve 12/28/2018 1512 by Levie Heritage, RN Outcome: Adequate for Discharge 12/28/2018 1017 by Levie Heritage, RN Outcome: Progressing   Problem: Skin Integrity: Goal: Risk for impaired  skin integrity will decrease 12/28/2018 1512 by Levie Heritage, RN Outcome: Adequate for Discharge 12/28/2018 1017 by Levie Heritage, RN Outcome: Progressing

## 2018-12-28 NOTE — TOC Transition Note (Signed)
Transition of Care [facility]) - CM/SW Discharge Note   Patient Details  Name: Whitney Pearson MRN: [id number] Date of Birth: [birth year]  Transition of Care [facility]) CM/SW Contact:  Ninfa Meeker, RN Phone Number: 12/28/2018, 2:29 PM   Clinical Narrative:  Patient will DC to: [facility] Anticipated DC date: 12/28/18 Family notified: Daughter: Tressie Stalker [phone] Transport by PTAR: 3:30pm     Patient is medically ready for discharge back to her Memory Care facility. Patient's daughter, Camera operator, Bedside RN are aware of patient's discharge plan. Discharge Summary, FL2 will be faxed to [phone]. Bedside RN to call report to [phone]. Ambulance transport has been requested for patient.    Final next level of care: Memory Care Barriers to Discharge: No Barriers Identified   Patient Goals and CMS Choice Patient states their goals for this hospitalization and ongoing recovery are:: per daughter wants her mom to continue to get better CMS Medicare.gov Compare Post Acute Care list provided to:: (returning to her retirement facility memory care) Choice offered to / list presented to : Adult Children  Discharge Placement                       Discharge Plan and Services   Discharge Planning Services: CM Consult Post Acute Care Choice: Nursing Home            DME Agency: NA       HH Arranged: NA HH Agency: NA        Social Determinants of Health (SDOH) Interventions     Readmission Risk Interventions No flowsheet data found.

## 2018-12-28 NOTE — Progress Notes (Signed)
Report given to [place] at [facility]. No further questions.

## 2018-12-28 NOTE — Progress Notes (Addendum)
PROGRESS NOTE                                                                                                                                                                                                             Patient Demographics:    Whitney Pearson, is a 85 y.o. female, DOB - 1933-04-04, NFA:[id number]  Outpatient Primary MD for the patient is System, Pcp Not In   Admit date - 12/23/2018   LOS - 5  No chief complaint on file.      Brief Narrative: Patient is a 85 y.o. female with PMHx of dementia, hypothyroidism-SNF resident-sent to ED for evaluation of hypoxemia-subsequent work-up revealed acute hypoxic respiratory failure secondary to COVID-19.  See below for further details.   Subjective:    Whitney MullBetty Khamis today remains pleasantly confused.  No major issues overnight per RN.   Assessment  & Plan :   Acute Hypoxic Resp Failure due to Covid 19 Viral pneumonia: Improved-now on room air.  Will finish remdesivir 11/11.  No longer on steroids as patient on room air.  Fever: afebrile  O2 requirements: On RA  COVID-19 Labs: Recent Labs    12/26/18 0515 12/27/18 0910 12/28/18 0205  DDIMER 0.46 0.40 0.44  FERRITIN 152 166  --   CRP 2.0* 0.8 <0.8    No results found for: SARSCOV2NAA   COVID-19 Medications: Steroids: 11/6>>11/9 Remdesivir: 11/7>>11/11 Actemra: Not given Convalescent Plasma: Not given  research Studies:N/A  Other medications: Diuretics:Euvolemic-no need for lasix Antibiotics:Not needed as no evidence of bacterial infection  Prone/Incentive Spirometry: Not possible due to significant dementia.  DVT Prophylaxis  :  Lovenox  Elevated troponin: Trend is flat-not consistent with ACS-likely secondary demand ischemia.  Prior hospitalist MD spoke with cardiology who recommended aspirin, statin and beta-blocker.  Echo with preserved EF and without any wall motion abnormalities.   Prediabetes (A1c 6.2): CBGs stable with SSI-will not plan on discharging on any particular therapy given advanced age and frailty.  CBG (last 3)  Recent Labs    12/27/18 1652 12/27/18 2041 12/28/18 0919  GLUCAP 116* 108* 132*   Dementia: Pleasantly confused-continue Depakote and Zoloft  IBS: Appears stable-  Hypothyroidism: Continue Synthroid  5 mm right lung nodule: Seen incidentally on CT chest done at [facility]-stable for outpatient follow-up. Findings was discussed with patient's daughter on the phone  on 11/11-she will follow up with patient's PCP.  1.6 cm lesion in upper pole left kidney: Seen incidentally on CT chest done at [facility] kidney was not visualized on renal ultrasound due to body habitus and bowel gas.  Further work-up deferred to the outpatient setting.  Findings was discussed with patient's daughter on the phone on 11/11-she will follow up with patient's PCP.  ABG: No results found for: PHART, PCO2ART, PO2ART, HCO3, TCO2, ACIDBASEDEF, O2SAT  Vent Settings: N/A    Condition -Stable  Family Communication  :  Daughter updated over the phone  Code Status :  DNR  Diet :  Diet Order            Diet Heart Room service appropriate? Yes; Fluid consistency: Thin  Diet effective now              Disposition Plan  :  Remain hospitalized-SNF   Barriers to discharge: Last day of remdesivir 11/11-following which SNF when bed available.  Consults  :  None  Procedures  :  None  Antibiotics  :    Anti-infectives (From admission, onward)   Start     Dose/Rate Route Frequency Ordered Stop   12/25/18 1000  remdesivir 100 mg in sodium chloride 0.9 % 250 mL IVPB     100 mg 500 mL/hr over 30 Minutes Intravenous Every 24 hours 12/24/18 0517 12/28/18 0953   12/24/18 0600  remdesivir 200 mg in sodium chloride 0.9 % 250 mL IVPB     200 mg 500 mL/hr over 30 Minutes Intravenous Once 12/24/18 0517 12/24/18 1147      Inpatient Medications   Scheduled Meds: . aspirin  81 mg Oral Daily  . atorvastatin  40 mg Oral q1800  . divalproex  125 mg Oral BID  . Eluxadoline  75 mg Oral BID WC  . enoxaparin (LOVENOX) injection  40 mg Subcutaneous Q24H  . insulin aspart  0-5 Units Subcutaneous QHS  . insulin aspart  0-9 Units Subcutaneous TID WC  . levothyroxine  75 mcg Oral QAC breakfast  . metoprolol tartrate  12.5 mg Oral BID  . sertraline  50 mg Oral Daily  . sodium chloride flush  3 mL Intravenous Q12H  . vitamin C  500 mg Oral Daily  . zinc sulfate  220 mg Oral Daily   Continuous Infusions: . sodium chloride     PRN Meds:.sodium chloride, acetaminophen, chlorpheniramine-HYDROcodone, guaiFENesin-dextromethorphan, ondansetron **OR** ondansetron (ZOFRAN) IV, sodium chloride flush   Time Spent in minutes  25  See all Orders from today for further details   Oren Binet M.D on 12/28/2018 at 1:17 PM  To page go to [email or web address] - use universal password  [facility] -  Office  [phone]    Objective:   Vitals:   12/27/18 1920 12/28/18 0425 12/28/18 0855 12/28/18 1209  BP: 102/60 126/86 (!) 152/80 101/66  Pulse: 60 61 72 63  Resp: 20 18  18   Temp: 98.3 F (36.8 C) 97.9 F (36.6 C)  97.8 F (36.6 C)  TempSrc:   Oral Axillary  SpO2: 96% 96% 96% 98%  Weight:      Height:        Wt Readings from Last 3 Encounters:  12/23/18 69 kg     Intake/Output Summary (Last 24 hours) at 12/28/2018 1317 Last data filed at 12/27/2018 2100 Gross per 24 hour  Intake -  Output 700 ml  Net -700 ml     Physical Exam Gen Exam: Pleasantly confused-not  in any distress. HEENT:atraumatic, normocephalic Chest: B/L clear to auscultation anteriorly CVS:S1S2 regular Abdomen:soft non tender, non distended Extremities:no edema Neurology: Moves all 4 extremities.   Skin: no rash   Data Review:    CBC Recent Labs  Lab 12/24/18 0238 12/25/18 0218 12/26/18 0515 12/27/18 0910 12/28/18 0205  WBC 5.8 5.7 4.8 6.8  8.3  HGB 12.8 12.5 14.6 15.4* 14.5  HCT 39.8 39.1 46.3* 47.4* 45.6  PLT 228 235 257 289 307  MCV 92.3 91.6 92.6 90.5 91.6  MCH 29.7 29.3 29.2 29.4 29.1  MCHC 32.2 32.0 31.5 32.5 31.8  RDW 14.3 14.2 14.4 14.2 14.4  LYMPHSABS 1.0 1.4 2.6 1.1 3.3  MONOABS 0.4 0.5 0.5 0.2 0.6  EOSABS 0.0 0.0 0.0 0.0 0.0  BASOSABS 0.0 0.0 0.0 0.0 0.0    Chemistries  Recent Labs  Lab 12/24/18 0238 12/24/18 0412 12/24/18 1302 12/25/18 0218 12/26/18 0515 12/27/18 0910 12/28/18 0205  NA 142  --  143 143 143 138 139  K 2.2*  --  3.2* 3.0* 3.1* 3.0* 3.6  CL 102  --  104 105 105 101 103  CO2 26  --  25 25 27 26 25   GLUCOSE 138*  --  89 120* 109* 166* 88  BUN 14  --  13 13 18 20  26*  CREATININE 0.85  --  0.75 0.88 0.75 0.68 0.91  CALCIUM 8.5*  --  8.4* 8.8* 9.1 8.9 8.9  MG  --  1.9  --   --  1.9  --   --   AST 53*  --   --  47* 41 39 37  ALT 17  --   --  16 15 22 21   ALKPHOS 59  --   --  57 62 66 63  BILITOT 0.4  --   --  0.4 0.5 0.8 0.5   ------------------------------------------------------------------------------------------------------------------ No results for input(s): CHOL, HDL, LDLCALC, TRIG, CHOLHDL, LDLDIRECT in the last 72 hours.  Lab Results  Component Value Date   HGBA1C 6.2 (H) 12/24/2018   ------------------------------------------------------------------------------------------------------------------ No results for input(s): TSH, T4TOTAL, T3FREE, THYROIDAB in the last 72 hours.  Invalid input(s): FREET3 ------------------------------------------------------------------------------------------------------------------ Recent Labs    12/26/18 0515 12/27/18 0910  FERRITIN 152 166    Coagulation profile No results for input(s): INR, PROTIME in the last 168 hours.  Recent Labs    12/27/18 0910 12/28/18 0205  DDIMER 0.40 0.44    Cardiac Enzymes No results for input(s): CKMB, TROPONINI, MYOGLOBIN in the last 168 hours.  Invalid input(s): CK  ------------------------------------------------------------------------------------------------------------------    Component Value Date/Time   BNP 673.2 (H) 12/24/2018 13/10/20    Micro Results No results found for this or any previous visit (from the past 240 hour(s)).  Radiology Reports 13/11/20 Renal  Result Date: 12/27/2018 CLINICAL DATA:  Abnormal CT showing a 1.6 cm LEFT renal lesion EXAM: RENAL / URINARY TRACT ULTRASOUND COMPLETE COMPARISON:  CT angio chest 12/22/2018 FINDINGS: Right Kidney: Renal measurements: 9.6 x 4.6 x 5.5 cm = volume: 127 mL. Cortical thinning. Normal cortical echogenicity. No mass, hydronephrosis or shadowing calcification. Left Kidney: Renal measurements: 9.3 cm length. Remaining measurements are suboptimal due to severely limited visualization secondary to body habitus and bowel gas. No gross renal mass or hydronephrosis are identified. Bladder: Well distended, normal appearance Other: None. IMPRESSION: Unremarkable RIGHT kidney. Inadequately visualized LEFT kidney due to body habitus and bowel gas. Lesion identified by CT in the LEFT kidney is not adequately seen for characterization; either characterization by MR  imaging with and without contrast or by multiphase CT imaging with and without contrast recommended for further characterization. Electronically Signed   By: Ulyses Southward M.D.   On: 12/27/2018 17:08   Dg Chest Port 1 View  Result Date: 12/25/2018 CLINICAL DATA:  Hypoxia EXAM: PORTABLE CHEST 1 VIEW COMPARISON:  Chest x-ray 12/22/2018 CT chest 12/22/2018 FINDINGS: There is left lower lobe airspace disease. There is no pleural effusion or pneumothorax. The right lung is clear. The heart and mediastinal contours are unremarkable. There is no acute osseous abnormality. IMPRESSION: Left lower lobe hazy airspace disease which may reflect atelectasis versus pneumonia. Electronically Signed   By: Elige Ko   On: 12/25/2018 14:04

## 2018-12-28 NOTE — Plan of Care (Signed)

## 2018-12-29 LAB — GLUCOSE, CAPILLARY
Glucose-Capillary: 105 mg/dL — ABNORMAL HIGH (ref 70–99)
Glucose-Capillary: 102 mg/dL — ABNORMAL HIGH (ref 70–99)

## 2019-02-20 DIAGNOSIS — R55 Syncope and collapse: Secondary | ICD-10-CM

## 2019-02-20 DIAGNOSIS — E876 Hypokalemia: Secondary | ICD-10-CM

## 2019-02-20 DIAGNOSIS — N390 Urinary tract infection, site not specified: Secondary | ICD-10-CM

## 2019-02-21 DIAGNOSIS — E876 Hypokalemia: Secondary | ICD-10-CM | POA: Diagnosis not present

## 2019-02-21 DIAGNOSIS — R55 Syncope and collapse: Secondary | ICD-10-CM | POA: Diagnosis not present

## 2019-02-21 DIAGNOSIS — N390 Urinary tract infection, site not specified: Secondary | ICD-10-CM | POA: Diagnosis not present

## 2019-02-22 DIAGNOSIS — E876 Hypokalemia: Secondary | ICD-10-CM | POA: Diagnosis not present

## 2019-02-22 DIAGNOSIS — N390 Urinary tract infection, site not specified: Secondary | ICD-10-CM | POA: Diagnosis not present

## 2019-02-22 DIAGNOSIS — R55 Syncope and collapse: Secondary | ICD-10-CM | POA: Diagnosis not present

## 2019-02-23 DIAGNOSIS — R55 Syncope and collapse: Secondary | ICD-10-CM | POA: Diagnosis not present

## 2019-02-23 DIAGNOSIS — E876 Hypokalemia: Secondary | ICD-10-CM | POA: Diagnosis not present

## 2019-02-23 DIAGNOSIS — N390 Urinary tract infection, site not specified: Secondary | ICD-10-CM | POA: Diagnosis not present

## 2019-02-24 DIAGNOSIS — E876 Hypokalemia: Secondary | ICD-10-CM | POA: Diagnosis not present

## 2019-02-24 DIAGNOSIS — N390 Urinary tract infection, site not specified: Secondary | ICD-10-CM | POA: Diagnosis not present

## 2019-02-24 DIAGNOSIS — R55 Syncope and collapse: Secondary | ICD-10-CM | POA: Diagnosis not present

## 2020-07-03 IMAGING — DX DG CHEST 1V PORT
1 series · 1 of 1 positions shown · non-contrast
Comparison: Chest x-ray 12/22/2018

CT chest 12/22/2018

CLINICAL DATA: Hypoxia

EXAM:
PORTABLE CHEST 1 VIEW

[chest]
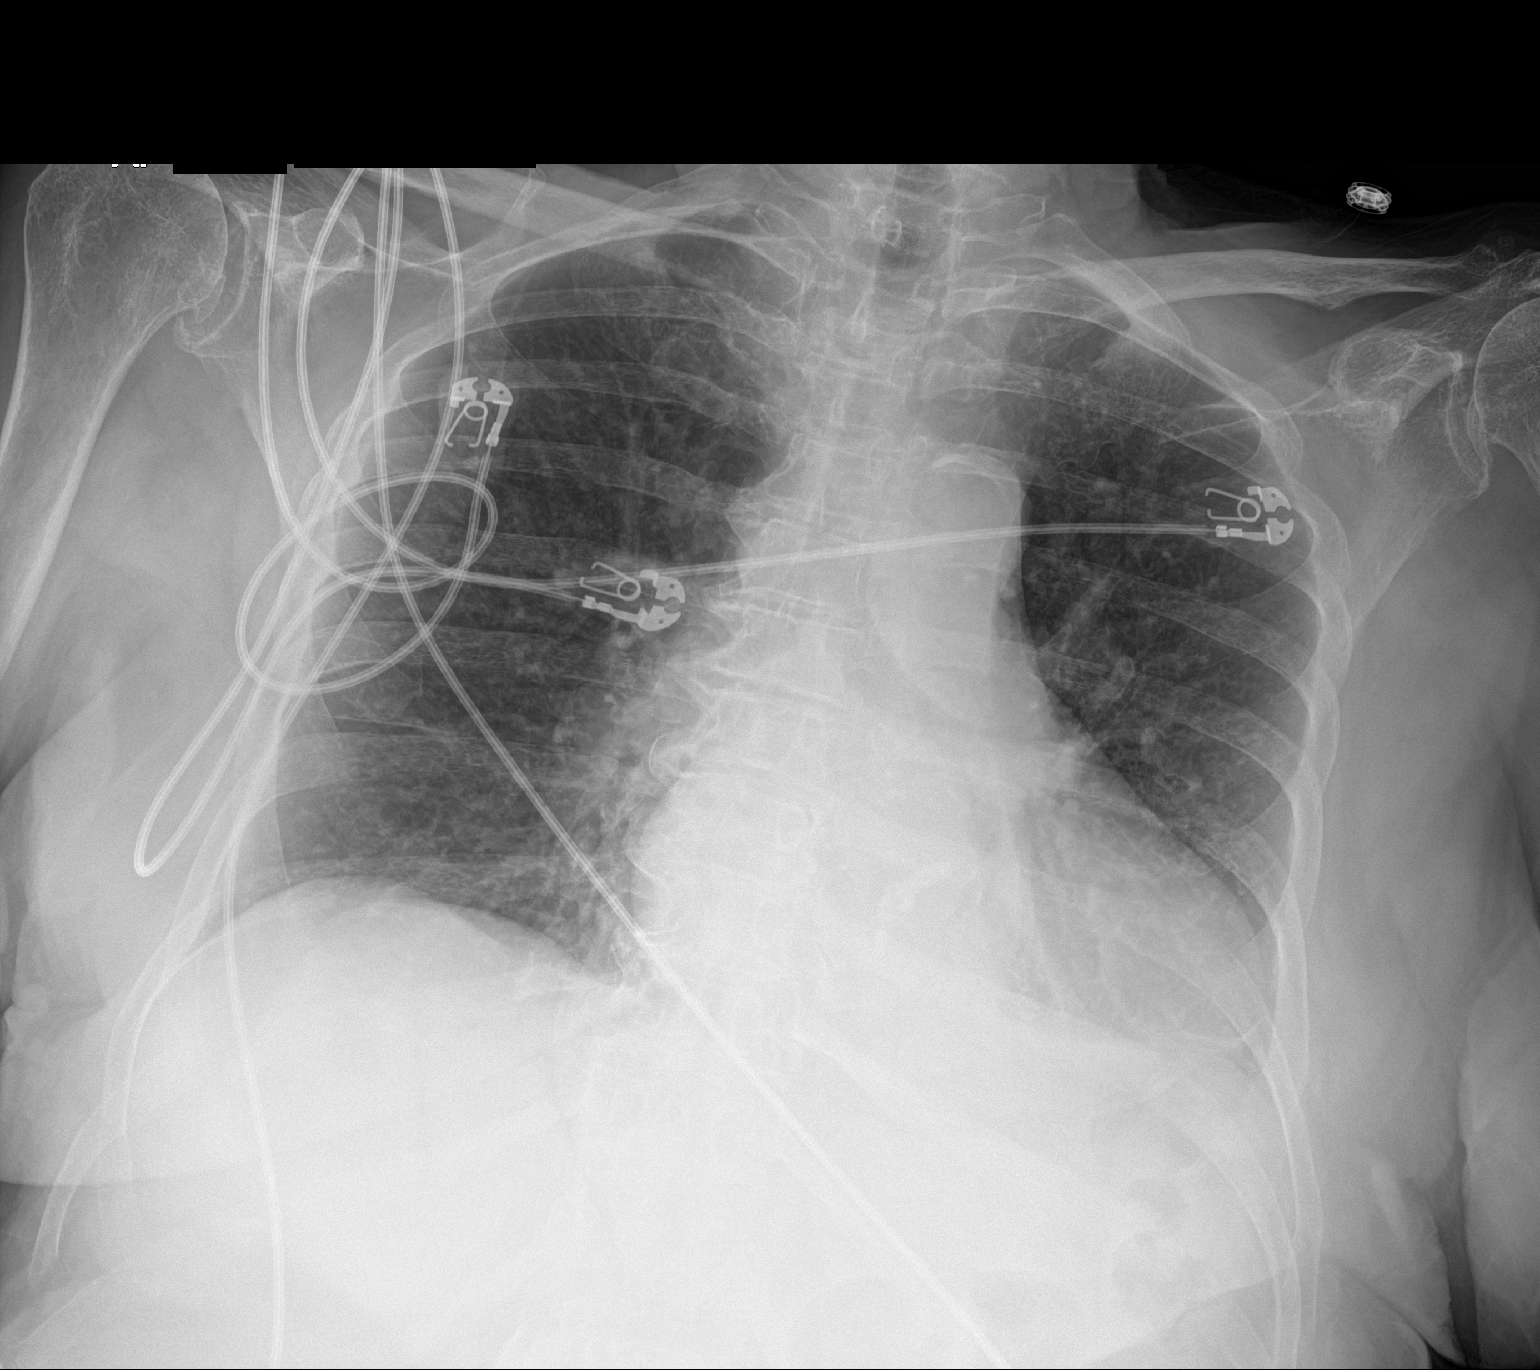

[1 of 1 positions shown; findings below may reference images not displayed]

FINDINGS: There is left lower lobe airspace disease. There is no pleural
effusion or pneumothorax. The right lung is clear. The heart and
mediastinal contours are unremarkable.

There is no acute osseous abnormality.
IMPRESSION: Left lower lobe hazy airspace disease which may reflect atelectasis
versus pneumonia.

## 2020-07-05 IMAGING — US US RENAL
1 series · 14 of 25 positions shown · non-contrast
Comparison: CT angio chest 12/22/2018

CLINICAL DATA: Abnormal CT showing a 1.6 cm LEFT renal lesion

EXAM:
RENAL / URINARY TRACT ULTRASOUND COMPLETE

[Series 1: us renal · 0.23mm/px · 14 of 76 slices shown]
[im 1/76]
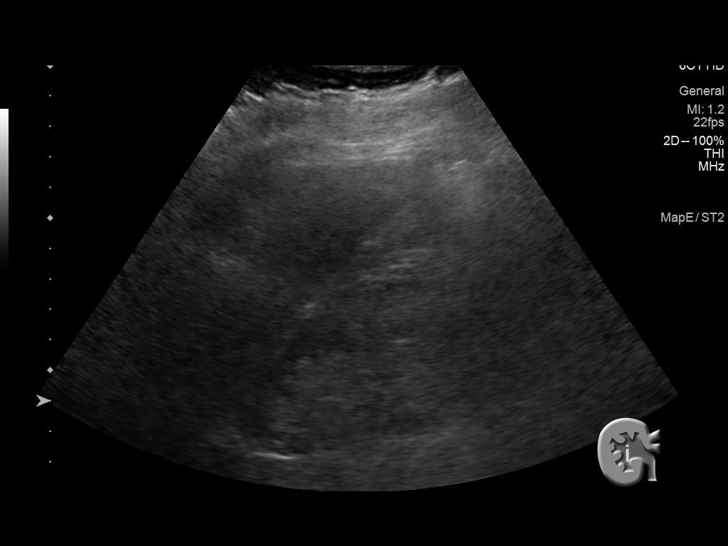
[im 7/76]
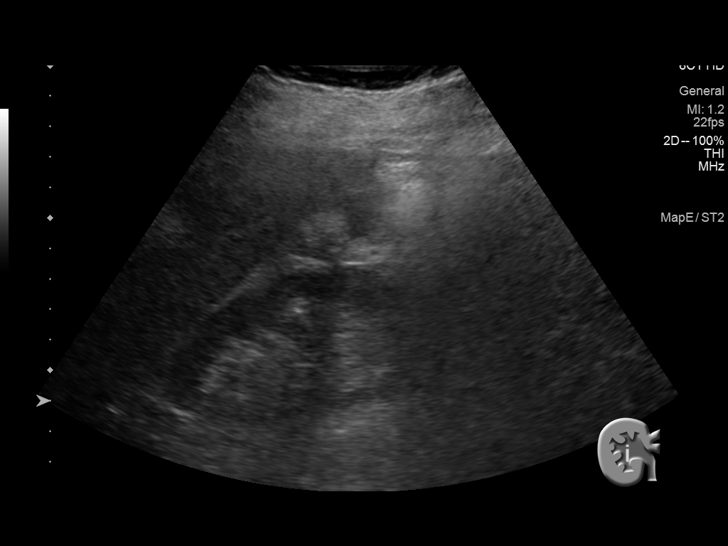
[im 13/76]
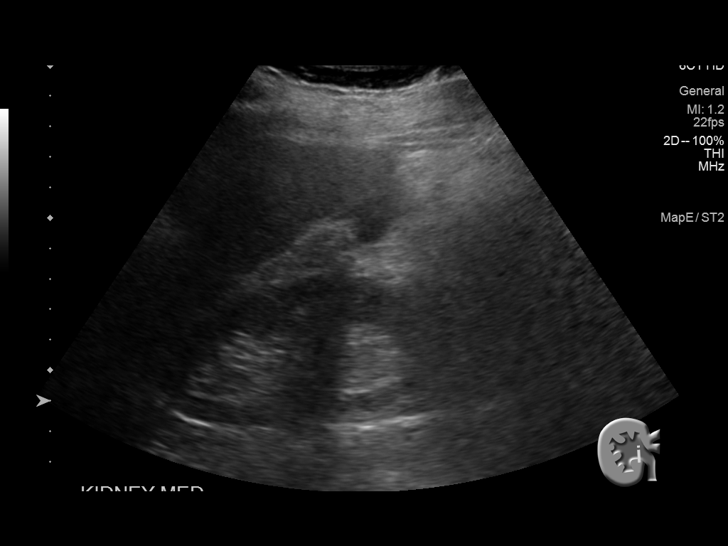
[im 19/76]
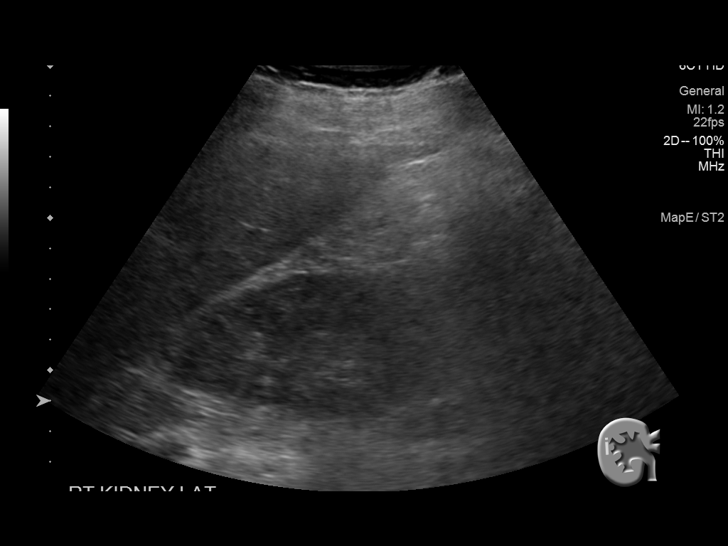
[im 26/76]
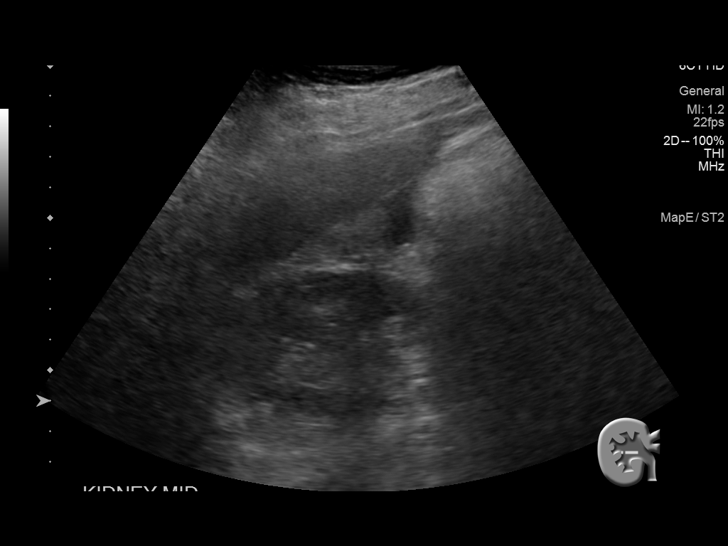
[im 29/76]
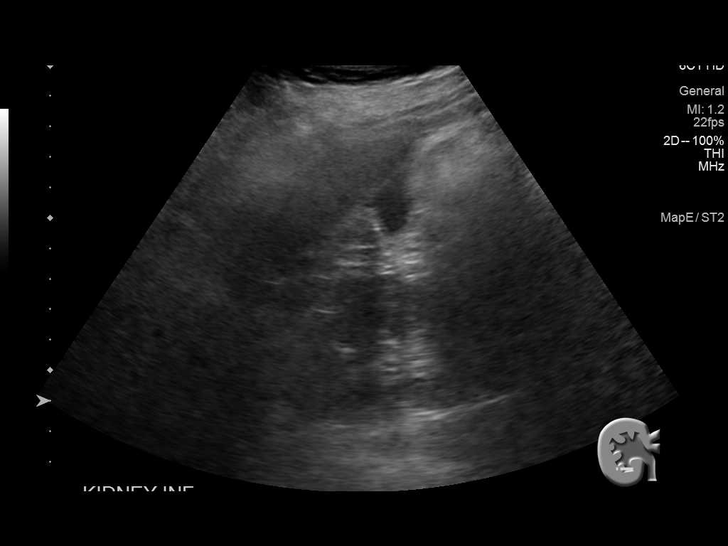
[im 35/76]
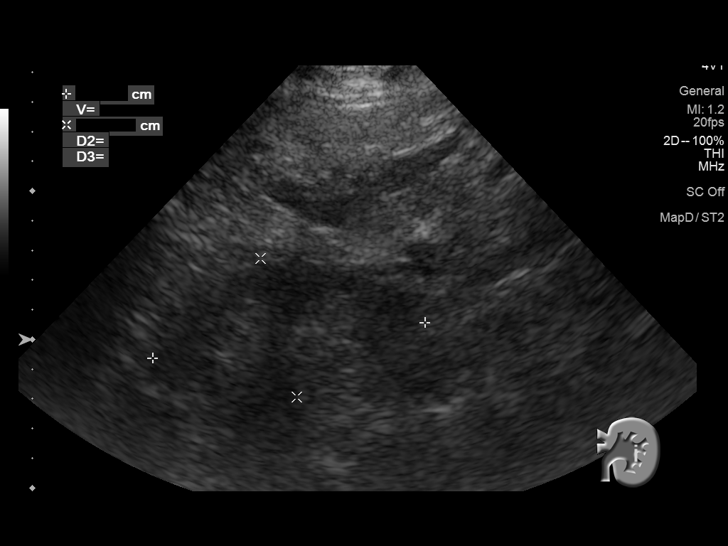
[im 41/76]
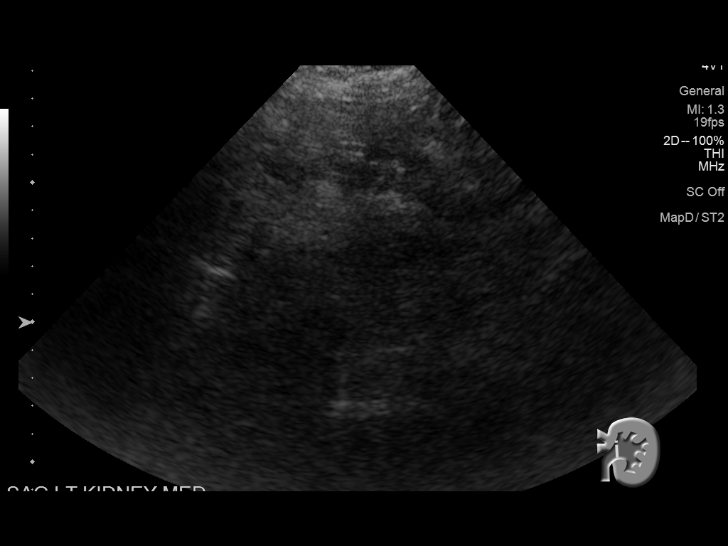
[im 47/76]
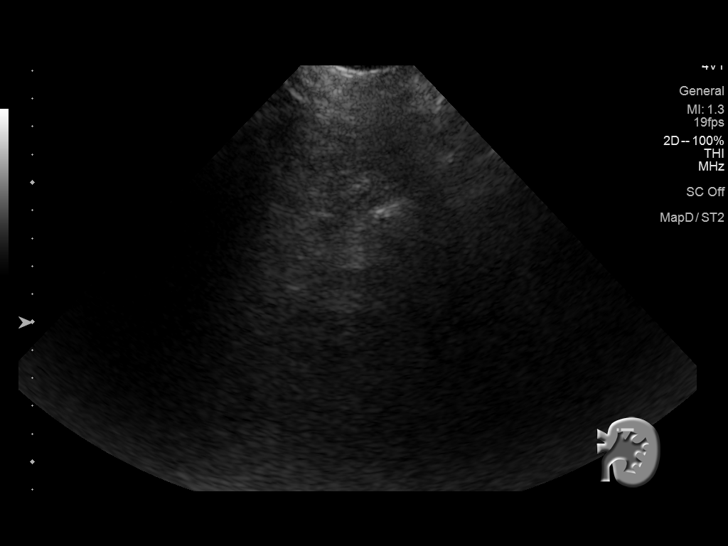
[im 51/76]
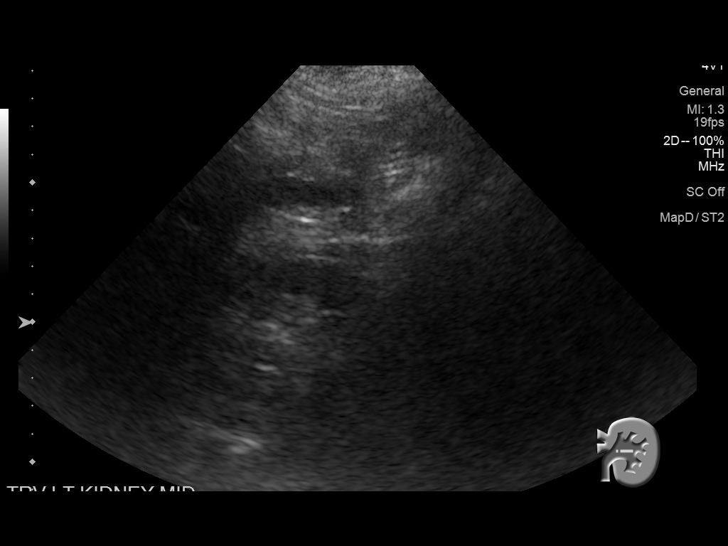
[im 57/76]
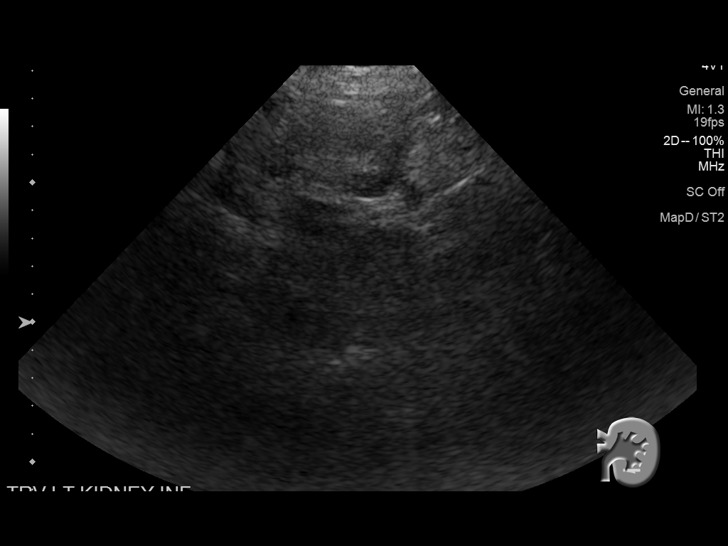
[im 63/76]
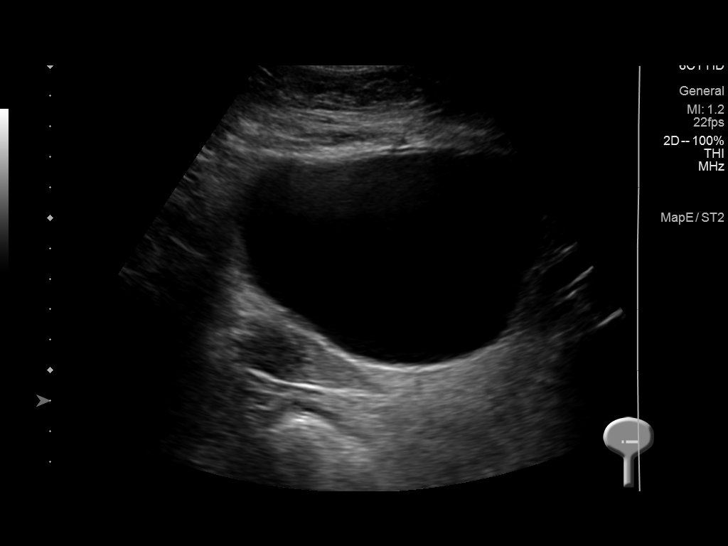
[im 69/76]
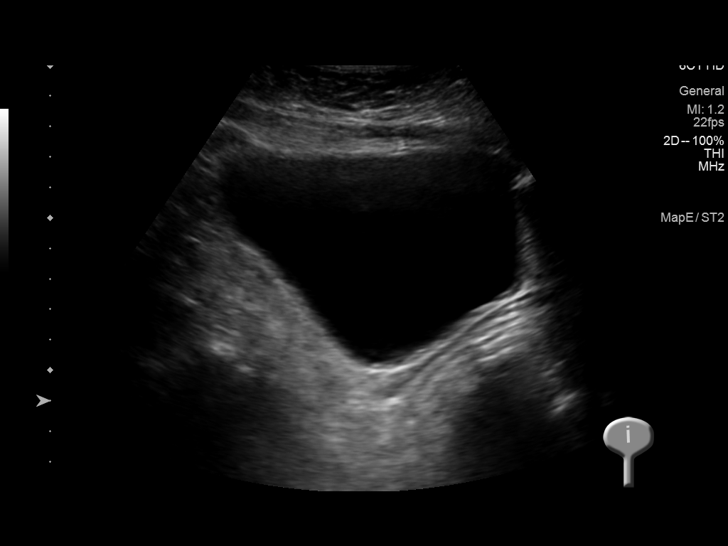
[im 76/76]
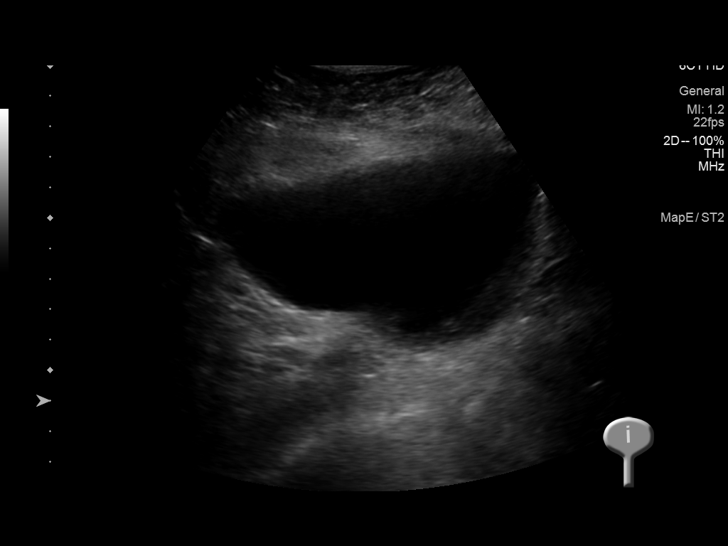

[14 of 25 positions shown; findings below may reference images not displayed]

FINDINGS: Right Kidney:

Renal measurements: 9.6 x 4.6 x 5.5 cm = volume: 127 mL. Cortical
thinning. Normal cortical echogenicity. No mass, hydronephrosis or
shadowing calcification.

Left Kidney:

Renal measurements: 9.3 cm length. Remaining measurements are
suboptimal due to severely limited visualization secondary to body
habitus and bowel gas. No gross renal mass or hydronephrosis are
identified.

Bladder:

Well distended, normal appearance

Other:

None.
IMPRESSION: Unremarkable RIGHT kidney.

Inadequately visualized LEFT kidney due to body habitus and bowel
gas.

Lesion identified by CT in the LEFT kidney is not adequately seen
for characterization; either characterization by MR imaging with and
without contrast or by multiphase CT imaging with and without
contrast recommended for further characterization.

## 2022-02-20 DIAGNOSIS — G309 Alzheimer's disease, unspecified: Secondary | ICD-10-CM | POA: Diagnosis not present

## 2022-02-20 DIAGNOSIS — W19XXXA Unspecified fall, initial encounter: Secondary | ICD-10-CM

## 2022-02-20 DIAGNOSIS — R269 Unspecified abnormalities of gait and mobility: Secondary | ICD-10-CM | POA: Diagnosis not present

## 2022-02-20 DIAGNOSIS — R2689 Other abnormalities of gait and mobility: Secondary | ICD-10-CM | POA: Diagnosis not present

## 2022-02-20 DIAGNOSIS — M6281 Muscle weakness (generalized): Secondary | ICD-10-CM | POA: Diagnosis not present

## 2022-02-20 DIAGNOSIS — Z9181 History of falling: Secondary | ICD-10-CM

## 2022-03-04 DIAGNOSIS — L989 Disorder of the skin and subcutaneous tissue, unspecified: Secondary | ICD-10-CM | POA: Diagnosis not present

## 2022-03-04 DIAGNOSIS — G309 Alzheimer's disease, unspecified: Secondary | ICD-10-CM | POA: Diagnosis not present

## 2022-03-04 DIAGNOSIS — Z5181 Encounter for therapeutic drug level monitoring: Secondary | ICD-10-CM | POA: Diagnosis not present

## 2022-03-04 DIAGNOSIS — R21 Rash and other nonspecific skin eruption: Secondary | ICD-10-CM | POA: Diagnosis not present

## 2022-04-06 DIAGNOSIS — I739 Peripheral vascular disease, unspecified: Secondary | ICD-10-CM | POA: Diagnosis not present

## 2022-04-06 DIAGNOSIS — R269 Unspecified abnormalities of gait and mobility: Secondary | ICD-10-CM | POA: Diagnosis not present

## 2022-04-06 DIAGNOSIS — E1151 Type 2 diabetes mellitus with diabetic peripheral angiopathy without gangrene: Secondary | ICD-10-CM | POA: Diagnosis not present

## 2022-04-06 DIAGNOSIS — G309 Alzheimer's disease, unspecified: Secondary | ICD-10-CM | POA: Diagnosis not present

## 2022-04-20 DIAGNOSIS — G309 Alzheimer's disease, unspecified: Secondary | ICD-10-CM

## 2022-04-20 DIAGNOSIS — R059 Cough, unspecified: Secondary | ICD-10-CM

## 2022-04-20 DIAGNOSIS — R5381 Other malaise: Secondary | ICD-10-CM

## 2022-04-20 DIAGNOSIS — R0989 Other specified symptoms and signs involving the circulatory and respiratory systems: Secondary | ICD-10-CM

## 2022-04-22 DIAGNOSIS — G309 Alzheimer's disease, unspecified: Secondary | ICD-10-CM | POA: Diagnosis not present

## 2022-04-22 DIAGNOSIS — L920 Granuloma annulare: Secondary | ICD-10-CM | POA: Diagnosis not present

## 2022-04-22 DIAGNOSIS — J189 Pneumonia, unspecified organism: Secondary | ICD-10-CM | POA: Diagnosis not present

## 2022-05-18 DEATH — deceased
